# Patient Record
Sex: Female | Born: 1965 | Race: White | Hispanic: No | Marital: Married | Smoking: Never smoker
Health system: Southern US, Community
[De-identification: ages and names within clinical notes are randomized; demographics above are authoritative.]

## PROBLEM LIST (undated history)

## (undated) DIAGNOSIS — F419 Anxiety disorder, unspecified: Secondary | ICD-10-CM

## (undated) DIAGNOSIS — D649 Anemia, unspecified: Secondary | ICD-10-CM

## (undated) DIAGNOSIS — M7751 Other enthesopathy of right foot: Secondary | ICD-10-CM

## (undated) DIAGNOSIS — J302 Other seasonal allergic rhinitis: Secondary | ICD-10-CM

## (undated) DIAGNOSIS — E119 Type 2 diabetes mellitus without complications: Secondary | ICD-10-CM

## (undated) HISTORY — DX: Other enthesopathy of right foot and ankle: M77.51

## (undated) HISTORY — DX: Anemia, unspecified: D64.9

## (undated) HISTORY — DX: Type 2 diabetes mellitus without complications: E11.9

## (undated) HISTORY — DX: Other seasonal allergic rhinitis: J30.2

## (undated) HISTORY — PX: NASAL SINUS SURGERY: SHX719

## (undated) HISTORY — DX: Anxiety disorder, unspecified: F41.9

## (undated) HISTORY — PX: TONSILLECTOMY: SUR1361

---

## 2000-01-16 ENCOUNTER — Other Ambulatory Visit: Admission: RE | Admit: 2000-01-16 | Discharge: 2000-01-16 | Payer: Self-pay | Admitting: Obstetrics & Gynecology

## 2001-08-26 ENCOUNTER — Encounter: Payer: Self-pay | Admitting: Family Medicine

## 2001-08-26 ENCOUNTER — Encounter: Admission: RE | Admit: 2001-08-26 | Discharge: 2001-08-26 | Payer: Self-pay | Admitting: Family Medicine

## 2002-03-24 ENCOUNTER — Other Ambulatory Visit: Admission: RE | Admit: 2002-03-24 | Discharge: 2002-03-24 | Payer: Self-pay | Admitting: Obstetrics & Gynecology

## 2003-09-27 ENCOUNTER — Emergency Department (HOSPITAL_COMMUNITY): Admission: EM | Admit: 2003-09-27 | Discharge: 2003-09-27 | Payer: Self-pay | Admitting: *Deleted

## 2004-01-27 ENCOUNTER — Encounter: Admission: RE | Admit: 2004-01-27 | Discharge: 2004-01-27 | Payer: Self-pay | Admitting: Family Medicine

## 2004-08-21 ENCOUNTER — Encounter: Admission: RE | Admit: 2004-08-21 | Discharge: 2004-08-21 | Payer: Self-pay | Admitting: Family Medicine

## 2007-12-10 ENCOUNTER — Other Ambulatory Visit: Admission: RE | Admit: 2007-12-10 | Discharge: 2007-12-10 | Payer: Self-pay | Admitting: Obstetrics & Gynecology

## 2012-11-17 ENCOUNTER — Encounter: Payer: Self-pay | Admitting: Obstetrics & Gynecology

## 2012-11-19 ENCOUNTER — Encounter: Payer: Self-pay | Admitting: Obstetrics & Gynecology

## 2012-11-19 ENCOUNTER — Ambulatory Visit (INDEPENDENT_AMBULATORY_CARE_PROVIDER_SITE_OTHER): Payer: BC Managed Care – PPO | Admitting: Obstetrics & Gynecology

## 2012-11-19 VITALS — BP 128/78 | HR 60 | Resp 16 | Ht 65.75 in | Wt 184.8 lb

## 2012-11-19 DIAGNOSIS — Z23 Encounter for immunization: Secondary | ICD-10-CM

## 2012-11-19 DIAGNOSIS — Z01419 Encounter for gynecological examination (general) (routine) without abnormal findings: Secondary | ICD-10-CM

## 2012-11-19 MED ORDER — VITAMIN D (ERGOCALCIFEROL) 1.25 MG (50000 UNIT) PO CAPS: ORAL_CAPSULE | ORAL | Status: DC

## 2012-11-19 NOTE — Patient Instructions (Addendum)

## 2012-11-19 NOTE — Progress Notes (Addendum)
47 y.o. G0P0000 MarriedCaucasianF here for annual exam.  Cycles regular.  She is very happy that cycles are more regular.  Flow is light, lasts 4-5 days total.  She is working on diet and losing weight to see if her triglycerides will improve.  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  She has started exercising regularly.  Just got back from [place] and [place].  Was 17 days.  Then did 15 day trip to [place], [place], and [place].  Took niece on trip.    Patient's last menstrual period was 11/02/2012.          Sexually active: yes  The current method of family planning is none.    Exercising: no  Not regularly Smoker:  no  Health Maintenance: Pap:  08/21/11 WNL/negative HR HPV History of abnormal Pap:  no MMG:  2013 [facility] (with release of records, last MMG was 08/08/10--pt will be notified) Colonoscopy:  none BMD:   2004 TDaP:  ? Screening Labs: PCP has new PCP at [facility] [place], Hb today: PCP, Urine today: PCP   reports that she has never smoked. She has never used smokeless tobacco. She reports that  drinks alcohol. She reports that she does not use illicit drugs.  Past Medical History  Diagnosis Date  . Anemia   . Seasonal allergies     Past Surgical History  Procedure Laterality Date  . Tonsillectomy    . Nasal sinus surgery      Current Outpatient Prescriptions  Medication Sig Dispense Refill  . Vitamin D, Ergocalciferol, (DRISDOL) 50000 UNITS CAPS capsule Take 50,000 Units by mouth. Every two weeks      . Loratadine-Pseudoephedrine (CLARITIN-D 12 HOUR PO) Take by mouth.      . naproxen sodium (ANAPROX) 220 MG tablet Take 220 mg by mouth as needed.       No current facility-administered medications for this visit.    Family History  Problem Relation Age of Onset  . Diabetes Mother   . Colon cancer Other     maternal cousins  . Hypertension Father     ROS:  Pertinent items are noted in HPI.  Otherwise, a comprehensive ROS was negative.  Exam:   BP 128/78  Pulse 60  Resp 16  Ht 5'  5.75" (1.67 m)  Wt 184 lb 12.8 oz (83.825 kg)  BMI 30.06 kg/m2  LMP 11/02/2012  Weight change: -6lbs  Height: 5' 5.75" (167 cm)  Ht Readings from Last 3 Encounters:  11/19/12 5' 5.75" (1.67 m)    General appearance: alert, cooperative and appears stated age Head: Normocephalic, without obvious abnormality, atraumatic Neck: no adenopathy, supple, symmetrical, trachea midline and thyroid normal to inspection and palpation Lungs: clear to auscultation bilaterally Breasts: normal appearance, no masses or tenderness Heart: regular rate and rhythm Abdomen: soft, non-tender; bowel sounds normal; no masses,  no organomegaly Extremities: extremities normal, atraumatic, no cyanosis or edema Skin: Skin color, texture, turgor normal. No rashes or lesions Lymph nodes: Cervical, supraclavicular, and axillary nodes normal. No abnormal inguinal nodes palpated Neurologic: Grossly normal   Pelvic: External genitalia:  no lesions              Urethra:  normal appearing urethra with no masses, tenderness or lesions              Bartholins and Skenes: normal                 Vagina: normal appearing vagina with normal color and discharge, no lesions  Cervix: no lesions              Pap taken: no Bimanual Exam:  Uterus:  normal size, contour, position, consistency, mobility, non-tender              Adnexa: normal adnexa and no mass, fullness, tenderness               Rectovaginal: Confirms               Anus:  normal sphincter tone, no lesions  A:  Well Woman with normal exam H/O anemia, resolved Uses no BC, declines Elevated triglycerides/elevated LDLs--declines blood work today.  P:   Mammogram yearly.  Will have patient sign release for last MMG to see if due. (was 5/12--pt will be notified.) pap smear with neg HR HPV 5/13.  No pap today. Rx for Vit D to pharmacy. return annually or prn  An After Visit Summary was printed and given to the patient.

## 2012-11-27 ENCOUNTER — Telehealth: Payer: Self-pay | Admitting: Obstetrics & Gynecology

## 2012-11-27 NOTE — Telephone Encounter (Signed)
At [facility] 8/14, pt thought she had a MMG last year.  Obtained records.  Last one was 08/08/10.  Called pt personally and advised her otherwise.  She will call and scheduled.  Number provided for scheduling.  She did not want me to schedule it for her.

## 2013-08-20 ENCOUNTER — Telehealth: Payer: Self-pay | Admitting: Obstetrics & Gynecology

## 2013-08-20 NOTE — Telephone Encounter (Signed)
patient is having abdominal pain in lower right quad. for about two weeks now. Leaving to go out of country in about a week wants to see what wrong

## 2013-08-20 NOTE — Telephone Encounter (Signed)
Spoke with patient. Advised appointment available for Monday at 1600 with Dr.Miller (time per Kennon RoundsSally). Patient agreeable to date and time. Appointment scheduled. Advised patient if symptoms worsen over the weekend or any new symptoms arise she needs to seek immediate care at the Emergency Department. Patient agreeable and verbalizes understanding.  Routing to provider for final review. Patient agreeable to disposition. Will close encounter

## 2013-08-20 NOTE — Telephone Encounter (Signed)
Spoke with patient. Advised spoke with Dr.Miller and we can have patient come in today at 2:30pm for evaluation. 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 Advised would check about any other availability and give patient a call back. Patient agreeable.

## 2013-08-20 NOTE — Telephone Encounter (Signed)
Spoke with patient. Patient states that she has been having lower right sided pelvic pain on and off for two weeks. Patient states "It is not sharp pain. It feels like pinching or someone poking me with a fork." Patient has not been taking any medication for pain as she states "It is not unbearable. I just want to get it checked out before I go out of the country in five weeks." Patient denies nausea, fever, and urinary/bowel symptoms. Advised patient would need to be seen for evaluation by Dr.Miller. Patient agreeable. Advised would speak with Dr.Miller about appointment time and give patient a call back. Patient is agreeable.

## 2013-08-23 ENCOUNTER — Ambulatory Visit (INDEPENDENT_AMBULATORY_CARE_PROVIDER_SITE_OTHER): Payer: BC Managed Care – PPO | Admitting: Obstetrics & Gynecology

## 2013-08-23 ENCOUNTER — Encounter: Payer: Self-pay | Admitting: Obstetrics & Gynecology

## 2013-08-23 VITALS — BP 130/62 | HR 72 | Resp 16 | Ht 65.5 in | Wt 205.2 lb

## 2013-08-23 DIAGNOSIS — R109 Unspecified abdominal pain: Secondary | ICD-10-CM

## 2013-08-23 NOTE — Progress Notes (Signed)
Subjective:     Patient ID: Stephanie Merritt, female   DOB: 08/02/1965, 48 y.o.   MRN: [id number]  HPI 48 yo G0  Pain is not with exertion.  Sometimes feel it when sitting.  Sometimes when she's standing.  Hasn't been able to come up with a trigger.    Bowel habits are normal.  Has a bowl movement every other day.  No constipation.  Bladder habits haven't change.  She does hold her urine a lot during the day.  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  No blood in urine.    Cycles have been changing this past year.  Cycles have been about every five weeks.  Not regular, however.  Flow is "normal" for her.  First day is spotting, the second and third days are heavy and then it tapers off.    Review of Systems  All other systems reviewed and are negative.      Objective:   Physical Exam  Constitutional: She appears well-developed and well-nourished.  Cardiovascular: Normal rate and regular rhythm.   Pulmonary/Chest: Effort normal and breath sounds normal.  Abdominal: Soft. Bowel sounds are normal. She exhibits no distension and no mass. There is tenderness (RLQ). There is no guarding.  Genitourinary: Vagina normal and uterus normal. There is no rash or tenderness on the right labia. Uterus is not tender. Cervix exhibits no motion tenderness. Right adnexum displays no mass and no tenderness. Left adnexum displays no mass and no tenderness. No signs of injury around the vagina. No vaginal discharge found.  Lymphadenopathy:       Right: No inguinal adenopathy present.       Left: No inguinal adenopathy present.       Assessment:     RLQ pain x 3 weeks Low back pain Weight gain     Plan:     Will check PUS.  Suspicion right now is musculoskeletal or small hernia.  D/W CT if persists. Will do u/a when she returns for PUS.  She cannot void today.

## 2013-08-23 NOTE — Progress Notes (Signed)
PUS scheduled for 09-02-13 at 4pm here in office.  Patient declined earlier appointment due to work schedule.  Advised of cancellation policy.

## 2013-09-02 ENCOUNTER — Ambulatory Visit (INDEPENDENT_AMBULATORY_CARE_PROVIDER_SITE_OTHER): Payer: BC Managed Care – PPO | Admitting: Obstetrics & Gynecology

## 2013-09-02 ENCOUNTER — Ambulatory Visit (INDEPENDENT_AMBULATORY_CARE_PROVIDER_SITE_OTHER): Payer: BC Managed Care – PPO

## 2013-09-02 VITALS — BP 110/72 | HR 70 | Resp 16 | Ht 65.75 in | Wt 206.0 lb

## 2013-09-02 DIAGNOSIS — R319 Hematuria, unspecified: Secondary | ICD-10-CM

## 2013-09-02 DIAGNOSIS — R1031 Right lower quadrant pain: Secondary | ICD-10-CM

## 2013-09-02 DIAGNOSIS — R1032 Left lower quadrant pain: Secondary | ICD-10-CM

## 2013-09-02 DIAGNOSIS — R109 Unspecified abdominal pain: Secondary | ICD-10-CM

## 2013-09-02 MED ORDER — VITAMIN D (ERGOCALCIFEROL) 1.25 MG (50000 UNIT) PO CAPS: ORAL_CAPSULE | ORAL | Status: DC

## 2013-09-02 NOTE — Progress Notes (Signed)
48 y.o. G0P0 Marriedfemale here for a pelvic ultrasound.  Pt seen 5/18 for three weeks of RLQ pain.  Pt did have tenderness on the RLQ on exam but no specific masses were noted.  Since that time, pain has resolved but is now on the right.  Denies urinary complaints.  Pt reports she does not have regular BMs and can go two to three days between.  Sometimes, bowel movements are hard and uncomfortable.  Pt traveling out of the country in June for 19 days.  Patient's last menstrual period was 08/02/2013.  Sexually active:  yes  Contraception: no method  FINDINGS: UTERUS: 7.7 x 5.7 x 4.0cm with 1.2cm intramural fibroid EMS: 9.44mm (cycle due to start within next two days) ADNEXA:   Left ovary 1.8 x 1.0 x 1.5cm   Right ovary 2.4 x 1. X 1.6cm with 11mm follicle CUL DE SAC: no free fluid  Pt and I reviewed ultrasound images and results.  Pt reports that the pain that had been persistent for three weeks, stopped for 4 days last week and then returned on the lower left side (where it had been on the right).  When I saw her for [facility], I really felt this could be a hernia based on description.  Now that the pain has moved, that is not possible.  Pt reassured that ultrasound is negative.  Pt does have a hx of constipation and irregular bowel movements.  Recommended she start colace 100mg  daily and Align probiotics as well as increase consumption of water.  Pt going on 19 days trip, leaving second week in June.  She is going to monitor symptoms and if anything worsens, she will call and I will proceed with CT scan.  If everything remains the same or improves, she will give me and update in two to three weeks.  All questions answered.  Assessment:  Intermittent RLQ and LLQ pain, small fibroids, small ovarian follice Plan: Trial of Colace and Align.  Pt to give report in two to three weeks.  May need to proceed with CT, although I feel this is much less necessary as pain has moved and actually resolved on right side  for several days.  ~20 minutes spent with patient >50% of time was in face to face discussion of above.

## 2013-09-03 ENCOUNTER — Encounter: Payer: Self-pay | Admitting: Obstetrics & Gynecology

## 2013-09-03 NOTE — Addendum Note (Signed)
Addended by: Elisha Headland on: 09/03/2013 05:11 PM   Modules accepted: Orders

## 2013-09-04 LAB — URINALYSIS, MICROSCOPIC ONLY
Bacteria, UA: NONE SEEN
CRYSTALS: NONE SEEN
Casts: NONE SEEN

## 2014-02-10 ENCOUNTER — Ambulatory Visit: Payer: BC Managed Care – PPO | Admitting: Obstetrics & Gynecology

## 2014-03-30 ENCOUNTER — Encounter: Payer: Self-pay | Admitting: Obstetrics & Gynecology

## 2014-03-30 ENCOUNTER — Ambulatory Visit (INDEPENDENT_AMBULATORY_CARE_PROVIDER_SITE_OTHER): Payer: BC Managed Care – PPO | Admitting: Obstetrics & Gynecology

## 2014-03-30 VITALS — BP 128/62 | HR 84 | Resp 16 | Ht 65.5 in | Wt 204.4 lb

## 2014-03-30 DIAGNOSIS — Z124 Encounter for screening for malignant neoplasm of cervix: Secondary | ICD-10-CM

## 2014-03-30 DIAGNOSIS — Z01419 Encounter for gynecological examination (general) (routine) without abnormal findings: Secondary | ICD-10-CM

## 2014-03-30 DIAGNOSIS — Z0000 Encounter for general adult medical examination without abnormal findings: Secondary | ICD-10-CM

## 2014-03-30 LAB — COMPREHENSIVE METABOLIC PANEL
ALBUMIN: 4.2 g/dL (ref 3.5–5.2)
ALK PHOS: 70 U/L (ref 39–117)
ALT: 13 U/L (ref 0–35)
AST: 15 U/L (ref 0–37)
BUN: 12 mg/dL (ref 6–23)
CO2: 24 meq/L (ref 19–32)
Calcium: 9.5 mg/dL (ref 8.4–10.5)
Chloride: 106 mEq/L (ref 96–112)
Creat: 0.72 mg/dL (ref 0.50–1.10)
Glucose, Bld: 86 mg/dL (ref 70–99)
Potassium: 4 mEq/L (ref 3.5–5.3)
SODIUM: 138 meq/L (ref 135–145)
Total Bilirubin: 0.7 mg/dL (ref 0.2–1.2)
Total Protein: 7 g/dL (ref 6.0–8.3)

## 2014-03-30 LAB — LIPID PANEL
Cholesterol: 181 mg/dL (ref 0–200)
HDL: 36 mg/dL — ABNORMAL LOW (ref >39)
LDL CALC: 114 mg/dL — AB (ref 0–99)
Total CHOL/HDL Ratio: 5 Ratio
Triglycerides: 156 mg/dL — ABNORMAL HIGH (ref <150)
VLDL: 31 mg/dL (ref 0–40)

## 2014-03-30 NOTE — Progress Notes (Signed)
48 y.o. G0P0000 MarriedCaucasianF here for annual exam.  Reports she has started having some hot flashes.  Cycles are every 4-6 weeks.  This isn't a change for the patient.  D/w pt hot flashes could also be thyroid or diabetes.     Patient's last menstrual period was 02/06/2014 (approximate).          Sexually active: Yes.    The current method of family planning is none.    Exercising: No.  not regularly Smoker:  no  Health Maintenance: Pap:  08/21/11 WNL/negative HR HPV History of abnormal Pap:  no [facility]:  [facility] Colonoscopy:  none BMD:   none TDaP:  11/19/12 Screening Labs: today (ate at breakfast), Hb today: pending, Urine today: not done due to vaginal bleeding   reports that she has never smoked. She has never used smokeless tobacco. She reports that she does not drink alcohol or use illicit drugs.  Past Medical History  Diagnosis Date  . Anemia   . Seasonal allergies     Past Surgical History  Procedure Laterality Date  . Tonsillectomy    . Nasal sinus surgery      Current Outpatient Prescriptions  Medication Sig Dispense Refill  . Vitamin D, Ergocalciferol, (DRISDOL) 50000 UNITS CAPS capsule Take one weekly (Patient taking differently: Taking once every other week) 12 capsule 4  . acetaminophen (TYLENOL) 500 MG tablet Take 500 mg by mouth every 6 (six) hours as needed.    . naproxen sodium (ANAPROX) 220 MG tablet Take 220 mg by mouth as needed.     No current facility-administered medications for this visit.    Family History  Problem Relation Age of Onset  . Diabetes Mother   . Colon cancer Other     maternal cousins  . Hypertension Father     ROS:  Pertinent items are noted in HPI.  Otherwise, a comprehensive ROS was negative.  Exam:   BP 128/62 mmHg  Pulse 84  Resp 16  Ht 5' 5.5" (1.664 m)  Wt 204 lb 6.4 oz (92.715 kg)  BMI 33.48 kg/m2  LMP 02/06/2014 (Approximate)  Weight change:  +20#  Height: 5' 5.5" (166.4 cm)  Ht Readings from Last 3  Encounters:  03/30/14 5' 5.5" (1.664 m)  09/02/13 5' 5.75" (1.67 m)  08/23/13 5' 5.5" (1.664 m)    General appearance: alert, cooperative and appears stated age Head: Normocephalic, without obvious abnormality, atraumatic Neck: no adenopathy, supple, symmetrical, trachea midline and thyroid normal to inspection and palpation Lungs: clear to auscultation bilaterally Breasts: normal appearance, no masses or tenderness Heart: regular rate and rhythm Abdomen: soft, non-tender; bowel sounds normal; no masses,  no organomegaly Extremities: extremities normal, atraumatic, no cyanosis or edema Skin: Skin color, texture, turgor normal. No rashes or lesions Lymph nodes: Cervical, supraclavicular, and axillary nodes normal. No abnormal inguinal nodes palpated Neurologic: Grossly normal   Pelvic: External genitalia:  no lesions              Urethra:  normal appearing urethra with no masses, tenderness or lesions              Bartholins and Skenes: normal                 Vagina: normal appearing vagina with normal color and discharge, no lesions              Cervix: no lesions, started menses today  Pap taken: Yes.   Bimanual Exam:  Uterus:  normal size, contour, position, consistency, mobility, non-tender              Adnexa: normal adnexa and no mass, fullness, tenderness               Rectovaginal: Confirms               Anus:  normal sphincter tone, no lesions  Chaperone was present for exam.  A:  Well Woman with normal exam H/O anemia, resolved Uses no BC, declines Elevated triglycerides/elevated LDLs--declines blood work today.  P: Mammogram yearly. Pt clearly KNOWS this is overdue.  States she will call and schedule.   pap smear with neg HR HPV 5/13. Pap today. Vit D, TSH, CMP, Lipids today return annually or prn

## 2014-03-31 LAB — TSH: TSH: 1.57 u[IU]/mL (ref 0.350–4.500)

## 2014-03-31 LAB — VITAMIN D 25 HYDROXY (VIT D DEFICIENCY, FRACTURES): Vit D, 25-Hydroxy: 21 ng/mL — ABNORMAL LOW (ref 30–100)

## 2014-04-04 LAB — IPS PAP TEST WITH REFLEX TO HPV

## 2014-04-12 NOTE — Addendum Note (Signed)
Addended by: Jerene BearsMILLER, Alex Leahy S on: 04/12/2014 11:07 AM   Modules accepted: Kipp BroodSmartSet

## 2014-04-14 ENCOUNTER — Telehealth: Payer: Self-pay

## 2014-04-14 NOTE — Telephone Encounter (Signed)
-----   Message from Annamaria BootsMary Suzanne Miller, MD sent at 04/12/2014 11:07 AM EST ----- Inform pt CMP and TSH were normal.  Vit D is low at 21.  Needs 50K IU weekly for 12 weeks and then needs to take 2000 IU daily.  Repeat at [facility] next year.  Order placed.    Lipids are abnormal as well.  LDL 114 and HDL very low at 36.  This is an independent risk factor for development of heart disease.  She really needs to be working on exercise and weight loss.  She really needs to work on 10-20 pounds over the next year.  Will repeat at [facility] in one year.    Pap 02 recall.

## 2014-04-14 NOTE — Telephone Encounter (Signed)
Lmtcb//kn 

## 2014-04-25 MED ORDER — VITAMIN D (ERGOCALCIFEROL) 1.25 MG (50000 UNIT) PO CAPS: ORAL_CAPSULE | ORAL | Status: DC

## 2014-04-25 NOTE — Addendum Note (Signed)
Addended by: Jerene BearsMILLER, Laurann Mcmorris S on: 04/25/2014 01:20 PM   Modules accepted: Orders, SmartSet

## 2014-06-21 NOTE — Telephone Encounter (Signed)
Patient notified of all results-see result note//kn 

## 2015-05-30 ENCOUNTER — Ambulatory Visit: Payer: BC Managed Care – PPO | Admitting: Obstetrics & Gynecology

## 2015-07-24 ENCOUNTER — Ambulatory Visit (INDEPENDENT_AMBULATORY_CARE_PROVIDER_SITE_OTHER): Payer: BC Managed Care – PPO | Admitting: Obstetrics and Gynecology

## 2015-07-24 ENCOUNTER — Encounter: Payer: Self-pay | Admitting: Obstetrics and Gynecology

## 2015-07-24 VITALS — BP 130/70 | HR 84 | Resp 16 | Ht 65.5 in | Wt 211.0 lb

## 2015-07-24 DIAGNOSIS — Z833 Family history of diabetes mellitus: Secondary | ICD-10-CM

## 2015-07-24 DIAGNOSIS — Z01419 Encounter for gynecological examination (general) (routine) without abnormal findings: Secondary | ICD-10-CM | POA: Diagnosis not present

## 2015-07-24 DIAGNOSIS — E559 Vitamin D deficiency, unspecified: Secondary | ICD-10-CM

## 2015-07-24 DIAGNOSIS — Z0000 Encounter for general adult medical examination without abnormal findings: Secondary | ICD-10-CM | POA: Diagnosis not present

## 2015-07-24 NOTE — Patient Instructions (Signed)

## 2015-07-24 NOTE — Progress Notes (Signed)
Patient ID: Stephanie Merritt, female   DOB: 02/09/1966, 50 y.o.   MRN: [id number] 50 y.o. G0P0000 MarriedCaucasianF here for annual exam.  Cycles q 4-6 weeks, no change. They have gotten heavier and crampier in the last few years. No BTB. Cramps are tolerable, helped with OTC medication. Sexually active, +/- contraception. She has a h/o low vit d, was taking 50,000 IU every 1-2 weeks, not good about taking pills. She ran out 3 months ago.  Period Duration (Days): 4-6 days  Period Pattern: (!) Irregular Menstrual Flow: Heavy Menstrual Control: Tampon Menstrual Control Change Freq (Hours): changes super tampon every 2 hours the first two days of her cycle Dysmenorrhea: (!) Moderate Dysmenorrhea Symptoms: Cramping  Patient's last menstrual period was 07/08/2015.          Sexually active: Yes.    The current method of family planning is none.    Exercising: No.  The patient does not participate in regular exercise at present. Smoker:  no  Health Maintenance: Pap:  03-30-14 WNL History of abnormal Pap:  no MMG:  09-06-14- abnormal- left breast BX- WNL  Colonoscopy: Never BMD:   Years ago- WNL Per Patient  TDaP:  11-19-12 Gardasil: N/A   reports that she has never smoked. She has never used smokeless tobacco. She reports that she does not drink alcohol or use illicit drugs. Teaches English 9 and History.   Past Medical History  Diagnosis Date  . Anemia   . Seasonal allergies     Past Surgical History  Procedure Laterality Date  . Tonsillectomy    . Nasal sinus surgery      Current Outpatient Prescriptions  Medication Sig Dispense Refill  . acetaminophen (TYLENOL) 500 MG tablet Take 500 mg by mouth every 6 (six) hours as needed.    . naproxen sodium (ANAPROX) 220 MG tablet Take 220 mg by mouth as needed.    . Vitamin D, Ergocalciferol, (DRISDOL) 50000 UNITS CAPS capsule Take one weekly 12 capsule 4   No current facility-administered medications for this visit.    Family History   Problem Relation Age of Onset  . Diabetes Mother   . Colon cancer Other     maternal cousins  . Hypertension Father     Review of Systems  Constitutional: Negative.   HENT: Negative.   Eyes: Negative.   Respiratory: Negative.   Cardiovascular: Negative.   Gastrointestinal: Negative.   Endocrine: Negative.   Genitourinary: Positive for menstrual problem.       Irregular / heavy menstrual cycles   Musculoskeletal: Negative.   Skin: Negative.   Allergic/Immunologic: Negative.   Neurological: Negative.   Psychiatric/Behavioral: Negative.   She is having BM every time she eats, this has been happening since she was in [place] last July. No discomfort. Can be watery to loose.   Exam:   BP 130/70 mmHg  Pulse 84  Resp 16  Ht 5' 5.5" (1.664 m)  Wt 211 lb (95.709 kg)  BMI 34.57 kg/m2  LMP 07/08/2015  Weight change: @WEIGHTCHANGE @ Height:   Height: 5' 5.5" (166.4 cm)  Ht Readings from Last 3 Encounters:  07/24/15 5' 5.5" (1.664 m)  03/30/14 5' 5.5" (1.664 m)  09/02/13 5' 5.75" (1.67 m)    General appearance: alert, cooperative and appears stated age Head: Normocephalic, without obvious abnormality, atraumatic Neck: no adenopathy, supple, symmetrical, trachea midline and thyroid normal to inspection and palpation Lungs: clear to auscultation bilaterally Breasts: normal appearance, no masses or tenderness Heart: regular rate and  rhythm Abdomen: soft, non-tender; bowel sounds normal; no masses,  no organomegaly Extremities: extremities normal, atraumatic, no cyanosis or edema Skin: Skin color, texture, turgor normal. No rashes or lesions Lymph nodes: Cervical, supraclavicular, and axillary nodes normal. No abnormal inguinal nodes palpated Neurologic: Grossly normal   Pelvic: External genitalia:  no lesions              Urethra:  normal appearing urethra with no masses, tenderness or lesions              Bartholins and Skenes: normal                 Vagina: normal appearing  vagina with normal color and discharge, no lesions              Cervix: no lesions               Bimanual Exam:  Uterus:  normal size, contour, position, consistency, mobility, non-tender              Adnexa: no mass, fullness, tenderness               Rectovaginal: Confirms               Anus:  normal sphincter tone, no lesions  Chaperone was present for exam.  A:  Well Woman with normal exam  Loose stools  P:   No pap this year  Mammogram due in May  F/U with GI for evaluation of bowel changes and Colonoscopy  Vit D, HgbA1C, FLP, TSH, CMP (she will return for fasting labs)  Discussed breast self exam  Discussed calcium and vit D intake

## 2015-07-24 NOTE — Addendum Note (Signed)
Addended by: Joeseph AmorFAST, Leverne Amrhein L on: 07/24/2015 04:21 PM   Modules accepted: Orders

## 2015-07-29 LAB — CBC
HCT: 36.8 % (ref 35.0–45.0)
Hemoglobin: 11.3 g/dL — ABNORMAL LOW (ref 11.7–15.5)
MCH: 22.7 pg — AB (ref 27.0–33.0)
MCHC: 30.7 g/dL — ABNORMAL LOW (ref 32.0–36.0)
MCV: 74 fL — AB (ref 80.0–100.0)
MPV: 9 fL (ref 7.5–12.5)
PLATELETS: 430 10*3/uL — AB (ref 140–400)
RBC: 4.97 MIL/uL (ref 3.80–5.10)
RDW: 17.3 % — AB (ref 11.0–15.0)
WBC: 8.5 10*3/uL (ref 3.8–10.8)

## 2015-07-29 LAB — COMPREHENSIVE METABOLIC PANEL
ALT: 12 U/L (ref 6–29)
AST: 12 U/L (ref 10–35)
Albumin: 4.1 g/dL (ref 3.6–5.1)
Alkaline Phosphatase: 69 U/L (ref 33–130)
BILIRUBIN TOTAL: 0.6 mg/dL (ref 0.2–1.2)
BUN: 12 mg/dL (ref 7–25)
CHLORIDE: 105 mmol/L (ref 98–110)
CO2: 23 mmol/L (ref 20–31)
CREATININE: 0.81 mg/dL (ref 0.50–1.05)
Calcium: 9.2 mg/dL (ref 8.6–10.4)
GLUCOSE: 90 mg/dL (ref 65–99)
Potassium: 4.4 mmol/L (ref 3.5–5.3)
Sodium: 138 mmol/L (ref 135–146)
Total Protein: 7.3 g/dL (ref 6.1–8.1)

## 2015-07-29 LAB — LIPID PANEL
Cholesterol: 198 mg/dL (ref 125–200)
HDL: 43 mg/dL — AB (ref >=46)
LDL CALC: 120 mg/dL (ref <130)
Total CHOL/HDL Ratio: 4.6 Ratio (ref <=5.0)
Triglycerides: 176 mg/dL — ABNORMAL HIGH (ref <150)
VLDL: 35 mg/dL — ABNORMAL HIGH (ref <30)

## 2015-07-29 LAB — TSH: TSH: 3 m[IU]/L

## 2015-07-29 LAB — HEMOGLOBIN A1C
HEMOGLOBIN A1C: 5.8 % — AB (ref <5.7)
MEAN PLASMA GLUCOSE: 120 mg/dL

## 2015-07-31 ENCOUNTER — Telehealth: Payer: Self-pay | Admitting: *Deleted

## 2015-07-31 LAB — VITAMIN D 25 HYDROXY (VIT D DEFICIENCY, FRACTURES): VIT D 25 HYDROXY: 18 ng/mL — AB (ref 30–100)

## 2015-07-31 NOTE — Telephone Encounter (Signed)
Patient is past due for recommended breast imaging due 02/2015  Last MMG and Results:   09/06/14 - stereotactic biopsy showing benign fibrocystic changes.  Pathology results are concordant with imaging findings.  Recommendation:  Follow-up left mammogram in 6 months  [facility], patient has not scheduled or completed recommended breast imaging due 02/2015.  Pt will be due for screening bilateral in May, but will need diagnostic left also.  Please call patient to schedule.  [facility]

## 2015-07-31 NOTE — Telephone Encounter (Signed)
LMTC in regards to setting up follow up mammogram-eh

## 2015-08-01 MED ORDER — VITAMIN D (ERGOCALCIFEROL) 1.25 MG (50000 UNIT) PO CAPS: ORAL_CAPSULE | ORAL | Status: DC

## 2015-08-01 NOTE — Addendum Note (Signed)
Addended by: Luisa DagoPHILLIPS, Sagan Wurzel C on: 08/01/2015 01:41 PM   Modules accepted: Orders

## 2015-08-01 NOTE — Telephone Encounter (Signed)
I left another message for patient to call -eh

## 2015-08-01 NOTE — Telephone Encounter (Signed)
Pt is notified of need for diagnostic and screening mammogram.  Patient states she is not able to do at this time due to demands of job.  She will do this summer when time allows.

## 2015-08-10 ENCOUNTER — Encounter: Payer: Self-pay | Admitting: *Deleted

## 2015-08-10 NOTE — Telephone Encounter (Signed)
Patient unable to schedule at this time.  Letter created.  Please advise recall.

## 2015-08-15 NOTE — Telephone Encounter (Signed)
Agree with sending letter.  Pt is aware of recommendation.  Ok to remove from recall.

## 2015-08-17 NOTE — Telephone Encounter (Signed)
Recall letter signed by Dr. Miller, marked as sent and mailed.  Pt removed from current recall.  Closing encounter. 

## 2015-08-28 ENCOUNTER — Telehealth: Payer: Self-pay | Admitting: *Deleted

## 2015-08-28 NOTE — Telephone Encounter (Signed)
Follow-up call to patient. See previous result note. Patient declined to schedule ultrasound stating she had it done last year.  Per DPR, cal leave detailed message on cell number and VM message confirms first and last name. Left message explaining this is a follow- up call to review recommendations from Dr Oscar LaJertson. Left message to call back.

## 2015-08-28 NOTE — Telephone Encounter (Signed)
-----   Message from Romualdo BolkJill Evelyn Jertson, MD sent at 08/09/2015  4:34 PM EDT ----- Please let the patient know that her last ultrasound was 2 years ago. She wasn't checked for anemia at that time, but she states her cycles are heavier and crampier than they were. I would still recommend she f/u for an ultrasound, possible sonohysterogram, possible biopsy. Things can change in 2 years. Ultimately the decision is her, but we could be missing pathology.

## 2015-09-27 NOTE — Telephone Encounter (Signed)
Follow-up call to patient. Again, voice mail confirms patient's name and DPR states can leave detailed message.  Left message calling to discuss important testing recommended by Dr Oscar LaJertson and important reasons why this was recommended. Stressed that we would prefer to discuss personally instead of over voice mail to be sure she understands significance in not having testing done. Left message to call back.

## 2015-10-18 NOTE — Telephone Encounter (Signed)
No response from patient. Detailed message left X2. (allowed per DPR). Any further action?

## 2015-10-19 NOTE — Telephone Encounter (Signed)
No, she has been informed. Thanks

## 2015-10-19 NOTE — Telephone Encounter (Signed)
Encounter closed

## 2016-03-05 ENCOUNTER — Ambulatory Visit (HOSPITAL_BASED_OUTPATIENT_CLINIC_OR_DEPARTMENT_OTHER)
Admission: RE | Admit: 2016-03-05 | Discharge: 2016-03-05 | Disposition: A | Discharge status: Home / Self Care | Payer: BC Managed Care – PPO | Source: Ambulatory Visit | Attending: Podiatry | Admitting: Podiatry

## 2016-03-05 ENCOUNTER — Ambulatory Visit (INDEPENDENT_AMBULATORY_CARE_PROVIDER_SITE_OTHER): Payer: BC Managed Care – PPO | Admitting: Podiatry

## 2016-03-05 ENCOUNTER — Encounter: Payer: Self-pay | Admitting: Podiatry

## 2016-03-05 DIAGNOSIS — M7731 Calcaneal spur, right foot: Secondary | ICD-10-CM

## 2016-03-05 DIAGNOSIS — R52 Pain, unspecified: Secondary | ICD-10-CM | POA: Diagnosis not present

## 2016-03-05 DIAGNOSIS — M7661 Achilles tendinitis, right leg: Secondary | ICD-10-CM | POA: Diagnosis not present

## 2016-03-05 MED ORDER — MELOXICAM 15 MG PO TABS
15.0000 mg | ORAL_TABLET | Freq: Every day | ORAL | 2 refills | Status: AC
Start: 2016-03-05 — End: 2017-03-05

## 2016-03-05 NOTE — Patient Instructions (Signed)

## 2016-03-05 NOTE — Progress Notes (Signed)
   Subjective:    Patient ID: Stephanie Merritt, female    DOB: 09/17/1965, 50 y.o.   MRN: [id number]  HPI  50 year old female presents the also concerns of posterior right heel pain which is been ongoing for about 1 year and is been intermittent in nature. She states that she has pain in the morning she first gets up of she sits down and stands backed out. She describes as a throbbing sensation. She denies any recent injury or trauma. No treatment. No numbness or tingling. The pain does not wake her up at night. No other complaints.  Review of Systems  All other systems reviewed and are negative.      Objective:   Physical Exam General: AAO x3, NAD  Dermatological: Skin is warm, dry and supple bilateral. Nails x 10 are well manicured; remaining integument appears unremarkable at this time. There are no open sores, no preulcerative lesions, no rash or signs of infection present.  Vascular: Dorsalis Pedis artery and Posterior Tibial artery pedal pulses are 2/4 bilateral with immedate capillary fill time. Pedal hair growth present. There is no pain with calf compression, swelling, warmth, erythema.   Neruologic: Grossly intact via light touch bilateral. Vibratory intact via tuning fork bilateral. Protective threshold with Semmes Wienstein monofilament intact to all pedal sites bilateral.   Musculoskeletal: Tenderness to the posterior aspect of the right heel on the prominent retrocalcaneal exostosis, distal portion of the Achilles tendon. The tendon appears to be intact. There is no defect noted and Thompson test is negative. There is no overlying edema, erythema, increase in warmth. There is no pain with lateral compression of calcaneus and no other areas of tenderness. Muscular strength 5/5 in all groups tested bilateral.  Gait: Unassisted, Nonantalgic.         Assessment & Plan:  50 year old female with right achilles tendonitis/heel spur -Treatment options discussed including all  alternatives, risks, and complications -Etiology of symptoms were discussed -X-rays ordered and reviewed with the patient -Night splint -Offloading heel pad -Stretching -Prescribed mobic. Discussed side effects of the medication and directed to stop if any are to occur and call the office.  -Shoe changes. -Ice -Follow-up in 4 weeks or sooner if any problems arise. In the meantime, encouraged to call the office with any questions, concerns, change in symptoms.   Ovid CurdMatthew Leib Elahi, DPM

## 2016-03-07 ENCOUNTER — Ambulatory Visit: Payer: BC Managed Care – PPO | Admitting: Podiatry

## 2016-06-11 ENCOUNTER — Encounter: Payer: Self-pay | Admitting: Podiatry

## 2016-06-11 ENCOUNTER — Ambulatory Visit (INDEPENDENT_AMBULATORY_CARE_PROVIDER_SITE_OTHER): Payer: BC Managed Care – PPO | Admitting: Podiatry

## 2016-06-11 DIAGNOSIS — M7731 Calcaneal spur, right foot: Secondary | ICD-10-CM | POA: Diagnosis not present

## 2016-06-11 DIAGNOSIS — M7661 Achilles tendinitis, right leg: Secondary | ICD-10-CM

## 2016-06-11 MED ORDER — METHYLPREDNISOLONE 4 MG PO TBPK: ORAL_TABLET | ORAL | 0 refills | Status: DC

## 2016-06-11 NOTE — Progress Notes (Signed)
Subjective: [name] presents to the office today for follow-up and continued pain to the back of the right heel. She states that overall it has improved. The night splint that she was wearing his been helping with the pain in the morning but she gets pain after skin on her feet for some time. She points to the posterior heel which is the majority of symptoms.she denies any recent injury or trauma. No swelling or redness. She has been stretching and icing as well as taking meloxicam intermittently. She has no new concerns today. Denies any systemic complaints such as fevers, chills, nausea, vomiting. No acute changes since last appointment, and no other complaints at this time.   Objective: AAO x3, NAD DP/PT pulses palpable bilaterally, CRT less than 3 seconds Retrocalcaneal exostosis palpable off the right posterior heel with mild tenderness to palpation on this area on the insertion of the Achilles tendon. There is no defect noted win the Achilles tendon and Janee Mornhompson is negative. No edema, erythema or increase in warmth. No pain with lateral compression of the calcaneous.  No open lesions or pre-ulcerative lesions.  No pain with calf compression, swelling, warmth, erythema  Assessment: Retrocalcaneal exostosis/tendonitis  Plan: -All treatment options discussed with the patient including all alternatives, risks, complications.  -She was asking about an injection today. Discussed a steroid injection as well as immobilization in a cam boot however because of this she wishes to hold off. -Will try medrol dose pack. Hold mobic. If symptoms continue discussed doing flector patch to the area and we can order this for her but will hold until after the steroids if needed. -Rx provided for PT -Continue home stretching, icing daily. Night splint -RTC in 3-4 weeks or sooner if needed.  -Patient encouraged to call the office with any questions, concerns, change in symptoms.   Ovid CurdMatthew Wagoner, DPM

## 2016-07-02 ENCOUNTER — Encounter: Payer: Self-pay | Admitting: Podiatry

## 2016-07-02 ENCOUNTER — Ambulatory Visit (INDEPENDENT_AMBULATORY_CARE_PROVIDER_SITE_OTHER): Payer: BC Managed Care – PPO | Admitting: Podiatry

## 2016-07-02 DIAGNOSIS — M7731 Calcaneal spur, right foot: Secondary | ICD-10-CM | POA: Diagnosis not present

## 2016-07-02 DIAGNOSIS — M7661 Achilles tendinitis, right leg: Secondary | ICD-10-CM

## 2016-07-02 MED ORDER — DICLOFENAC EPOLAMINE 1.3 % TD PTCH: 1.0000 | MEDICATED_PATCH | Freq: Two times a day (BID) | TRANSDERMAL | 1 refills | Status: DC

## 2016-07-03 NOTE — Progress Notes (Signed)
Subjective: [name] presents to the office today for follow-up and continued pain to the back of the right heel. She states that she is not getting the sharp shooting pain that she was having before but there is still painful. She is concerned about the pain is she wants to enjoy her summer vacation. She did not start physical therapy due to time constraints which she has not call to try to schedule this. She states that she's been stretching more which is been helping as well as using the night splint. She has not been taking the anti-inflammatories.  Denies any systemic complaints such as fevers, chills, nausea, vomiting. No acute changes since last appointment, and no other complaints at this time.   Objective: AAO x3, NAD DP/PT pulses palpable bilaterally, CRT less than 3 seconds Retrocalcaneal exostosis palpable off the right posterior heel with mild tenderness to palpation on this area on the insertion of the Achilles tendon. There is no defect noted win the Achilles tendon and Stephanie Merritt is negative. There is mild erythema from irritation in shoes but there is no ascending synovitis or increase in warmth. There does be to be a trace amount of edema to the posterior lateral calcaneus however minimal. No pain with lateral compression of the calcaneous.  No open lesions or pre-ulcerative lesions.  No pain with calf compression, swelling, warmth, erythema  Assessment: Retrocalcaneal exostosis/tendonitis  Plan: -All treatment options discussed with the patient including all alternatives, risks, complications.  -At this time a discussed with her steroid injection and immobilization over she declines this today -I ordered Flector patches -Encouraged her to start physical therapy. She will call to see if they can do late hours. -Continue with stretching, icing daily. -She wished about any surgical intervention -Discussed custom orthotics orthotic -RTC as scheduled or sooner if needed.    Stephanie Merritt, DPM

## 2016-07-04 ENCOUNTER — Telehealth: Payer: Self-pay | Admitting: *Deleted

## 2016-07-04 DIAGNOSIS — M7731 Calcaneal spur, right foot: Secondary | ICD-10-CM

## 2016-07-04 DIAGNOSIS — M7661 Achilles tendinitis, right leg: Secondary | ICD-10-CM

## 2016-07-04 NOTE — Telephone Encounter (Signed)
Stephanie Merritt - [facility] states needs PT rx faxed to office. I reviewed Dr. Gabriel RungWagoner's LOV clinical and he had ordered PT for right achilles tendonitis. [facility]

## 2016-07-25 NOTE — Progress Notes (Addendum)
51 y.o. G0P0000 MarriedCaucasianF here for annual exam.  Pt has had some GI issues this week with associated diarrhea the last two days.  Did eat more normally this morning and is feeling some better.  Has not had diarrhea today but feels her abdomen is a little "rumbley".  Major issue for her is a heel bone spur.  Having some tendonitis with this.  Seeing Dr. Ardelle Anton and PT.  This has helped.  Pt had not done follow up on her breasts.  Did have a biopsy 5/16 with follow up.    Bleeding has changed over the past year.  Cycles are about every six weeks now.  Flow lasts 5 days.  PCP:  Dr. Luciana Axe.  Was seen in her office in February.    Patient's last menstrual period was 07/03/2016 (approximate).          Sexually active: Yes.    The current method of family planning is none.    Exercising: No.  The patient does not participate in regular exercise at present. Smoker:  no  Health Maintenance: Pap:  03/30/14 Neg   08/21/11 Neg. HR HPV:neg  History of abnormal Pap:  no MMG:  09/06/14 Left Biopsy: benign results. recommended f/u 6 months. Patient has not f/u since then Colonoscopy:  Never.  Declines.  Would consider a Cologuard.  States she's done a lot of research about this. BMD:  Never TDaP:  11/2012 Pneumonia vaccine(s):  N/A Zostavax:   N/A Hep C testing: N/A Screening Labs: PCP   reports that she has never smoked. She has never used smokeless tobacco. She reports that she does not drink alcohol or use drugs.  Past Medical History:  Diagnosis Date  . Anemia   . Bone spur of right foot   . Seasonal allergies     Past Surgical History:  Procedure Laterality Date  . NASAL SINUS SURGERY    . TONSILLECTOMY      Current Outpatient Prescriptions  Medication Sig Dispense Refill  . meloxicam (MOBIC) 15 MG tablet Take 1 tablet (15 mg total) by mouth daily. 30 tablet 2  . naproxen sodium (ANAPROX) 220 MG tablet Take 220 mg by mouth as needed.    . Vitamin D, Ergocalciferol, (DRISDOL)  50000 units CAPS capsule Take one weekly 12 capsule 4   No current facility-administered medications for this visit.     Family History  Problem Relation Age of Onset  . Diabetes Mother   . Colon cancer Other     maternal cousins  . Hypertension Father     ROS:  Pertinent items are noted in HPI.  Otherwise, a comprehensive ROS was negative.  Exam:   BP 130/76 (BP Location: Right Arm, Patient Position: Sitting, Cuff Size: Normal)   Pulse 84   Resp 16   Ht 5' 5.5" (1.664 m)   Wt 213 lb (96.6 kg)   LMP 07/03/2016 (Approximate)   BMI 34.91 kg/m   Weight change: +9  Height: 5' 5.5" (166.4 cm)  Ht Readings from Last 3 Encounters:  07/26/16 5' 5.5" (1.664 m)  07/24/15 5' 5.5" (1.664 m)  03/30/14 5' 5.5" (1.664 m)    General appearance: alert, cooperative and appears stated age Head: Normocephalic, without obvious abnormality, atraumatic Neck: no adenopathy, supple, symmetrical, trachea midline and thyroid normal to inspection and palpation Lungs: clear to auscultation bilaterally Breasts: normal appearance, no masses or tenderness Heart: regular rate and rhythm Abdomen: soft, non-tender; bowel sounds normal; no masses,  no organomegaly Extremities:  extremities normal, atraumatic, no cyanosis or edema Skin: Skin color, texture, turgor normal. No rashes or lesions Lymph nodes: Cervical, supraclavicular, and axillary nodes normal. No abnormal inguinal nodes palpated Neurologic: Grossly normal   Pelvic: External genitalia:  no lesions              Urethra:  normal appearing urethra with no masses, tenderness or lesions              Bartholins and Skenes: normal                 Vagina: normal appearing vagina with normal color and discharge, no lesions              Cervix: no lesions              Pap taken: Yes.   Bimanual Exam:  Uterus:  normal size, contour, position, consistency, mobility, non-tender              Adnexa: normal adnexa and no mass, fullness, tenderness                Rectovaginal: Confirms               Anus:  Declined rectal exam  Chaperone was present for exam.  A:  Well Woman with normal exam Has never used OCPs Perimenopausal bleeding h/O elevated tirglycerides and elevated LDLs in the past.  Declines having this tested today  P:   Mammogram guidelines reviewed.  Pt knows she did not do anything for follow up from MMG in 2016 Declines colonoscopy.  Willing to do cologuard. pap smear and HR HPV obtained today Vit D and HbA1C today return annually or prn

## 2016-07-26 ENCOUNTER — Ambulatory Visit (INDEPENDENT_AMBULATORY_CARE_PROVIDER_SITE_OTHER): Payer: BC Managed Care – PPO | Admitting: Obstetrics & Gynecology

## 2016-07-26 ENCOUNTER — Encounter: Payer: Self-pay | Admitting: Obstetrics & Gynecology

## 2016-07-26 ENCOUNTER — Other Ambulatory Visit (HOSPITAL_COMMUNITY)
Admission: RE | Admit: 2016-07-26 | Discharge: 2016-07-26 | Disposition: A | Discharge status: Home / Self Care | Payer: BC Managed Care – PPO | Source: Ambulatory Visit | Attending: Obstetrics & Gynecology | Admitting: Obstetrics & Gynecology

## 2016-07-26 VITALS — BP 130/76 | HR 84 | Resp 16 | Ht 65.5 in | Wt 213.0 lb

## 2016-07-26 DIAGNOSIS — Z01419 Encounter for gynecological examination (general) (routine) without abnormal findings: Secondary | ICD-10-CM | POA: Diagnosis not present

## 2016-07-26 DIAGNOSIS — R7309 Other abnormal glucose: Secondary | ICD-10-CM | POA: Diagnosis not present

## 2016-07-26 DIAGNOSIS — E559 Vitamin D deficiency, unspecified: Secondary | ICD-10-CM

## 2016-07-26 DIAGNOSIS — B373 Candidiasis of vulva and vagina: Secondary | ICD-10-CM | POA: Insufficient documentation

## 2016-07-26 DIAGNOSIS — Z124 Encounter for screening for malignant neoplasm of cervix: Secondary | ICD-10-CM | POA: Diagnosis not present

## 2016-07-27 LAB — HEMOGLOBIN A1C
Hgb A1c MFr Bld: 5.7 % — ABNORMAL HIGH (ref <5.7)
MEAN PLASMA GLUCOSE: 117 mg/dL

## 2016-07-27 LAB — VITAMIN D 25 HYDROXY (VIT D DEFICIENCY, FRACTURES): Vit D, 25-Hydroxy: 18 ng/mL — ABNORMAL LOW (ref 30–100)

## 2016-07-29 ENCOUNTER — Telehealth: Payer: Self-pay | Admitting: *Deleted

## 2016-07-29 LAB — CYTOLOGY - PAP
ADEQUACY: ABSENT
Diagnosis: NEGATIVE
HPV: NOT DETECTED

## 2016-07-29 NOTE — Telephone Encounter (Signed)
Left voice mail to call back 

## 2016-07-29 NOTE — Telephone Encounter (Signed)
-----   Message from Jerene Bears, MD sent at 07/28/2016  9:18 PM EDT ----- Please let her know her Vit D is still low but she declines taking anything for this.  Her HbA1C is 5.7.  If she lost a little weight, this would normalize.  If she'd like to see a nutritionist, I can set this up.  I really do think she needs to re-establish care with her PCP and be seen yearly there as well.

## 2016-07-31 NOTE — Telephone Encounter (Signed)
Patient returning your call.

## 2016-08-01 NOTE — Telephone Encounter (Signed)
Routed to Stephanie

## 2016-08-01 NOTE — Telephone Encounter (Signed)
Pt notified in result note.  Closing encounter. 

## 2016-08-01 NOTE — Telephone Encounter (Signed)
Patient returned call.  I have attempted to contact this patient by phone with the following results: left message to return call to [facility] at [phone] on answering machine (mobile per [facility]).  Name verified in voicemail, advised call was follow up of previous conversation. [phone] (Mobile) *Preferred*

## 2016-08-05 ENCOUNTER — Telehealth: Payer: Self-pay | Admitting: Obstetrics & Gynecology

## 2016-08-05 MED ORDER — FLUCONAZOLE 150 MG PO TABS
ORAL_TABLET | ORAL | 0 refills | Status: DC
Start: 2016-08-05 — End: 2018-03-16

## 2016-08-05 MED ORDER — VITAMIN D (ERGOCALCIFEROL) 1.25 MG (50000 UNIT) PO CAPS: ORAL_CAPSULE | ORAL | 3 refills | Status: DC

## 2016-08-05 NOTE — Telephone Encounter (Signed)
Left message to call Ariyanna Oien at [phone]. 

## 2016-08-05 NOTE — Telephone Encounter (Signed)
Patient left message following up on prescription request. She checked with her pharmacy and it was not there.

## 2016-08-05 NOTE — Telephone Encounter (Signed)
Patient states she received results last week and rx for Diflucan and Vitamin D have not been sent to her pharmacy. Per review of results note dated 07/26/2016 rx for Diflucan 150 mg po x 1 repeat in 72 hours if symptoms persist #2 0RF sent to pharmacy on file. Rx for Vitamin D 50,000 IU weekly #12 3RF sent to pharmacy on file. Patient is agreeable. Please see results note for details.   Routing to provider for final review. Patient agreeable to disposition. Will close encounter.

## 2016-08-08 ENCOUNTER — Encounter: Payer: Self-pay | Admitting: Obstetrics & Gynecology

## 2016-08-08 ENCOUNTER — Ambulatory Visit: Payer: BC Managed Care – PPO | Admitting: Obstetrics and Gynecology

## 2016-11-14 ENCOUNTER — Telehealth: Payer: Self-pay | Admitting: *Deleted

## 2016-11-14 NOTE — Telephone Encounter (Signed)
Message left for patient to return call regarding repeat Vit D. Advised to ask for [facility]. [phone] (Mobile) *Preferred*  Patient will need orders entered when appointment scheduled.

## 2016-11-14 NOTE — Telephone Encounter (Signed)
-----   Message from Luisa DagoStephanie C Abdelrahman Nair, [place] sent at 08/01/2016 12:37 PM EDT ----- Regarding: Vit D repeat Pt needs repeat vit D 12 weeks from 08/01/16.  [place].  She will plan for appointment around 11/14/16. Out of country until 11/10/16.

## 2016-11-27 NOTE — Telephone Encounter (Signed)
Phone call to patient to follow up Vitamin D. Patient states she has not been taking Vitamin D. She has been taking the last couple of weeks. Lab appointment scheduled for 02/17/17 to recheck Vitamin D.

## 2017-02-17 ENCOUNTER — Other Ambulatory Visit (INDEPENDENT_AMBULATORY_CARE_PROVIDER_SITE_OTHER): Payer: BC Managed Care – PPO

## 2017-02-17 DIAGNOSIS — E559 Vitamin D deficiency, unspecified: Secondary | ICD-10-CM

## 2017-02-18 LAB — VITAMIN D 25 HYDROXY (VIT D DEFICIENCY, FRACTURES): VIT D 25 HYDROXY: 22.3 ng/mL — AB (ref 30.0–100.0)

## 2017-03-18 ENCOUNTER — Ambulatory Visit: Payer: BC Managed Care – PPO | Admitting: Podiatry

## 2017-06-06 IMAGING — DX DG FOOT COMPLETE 3+V*R*
3 series · 3 of 3 positions shown · non-contrast
Comparison: None.

CLINICAL DATA: Struck heel on bleachers 10 years ago. Chronic pain.

EXAM:
RIGHT FOOT COMPLETE - 3+ VIEW

[foot ap]
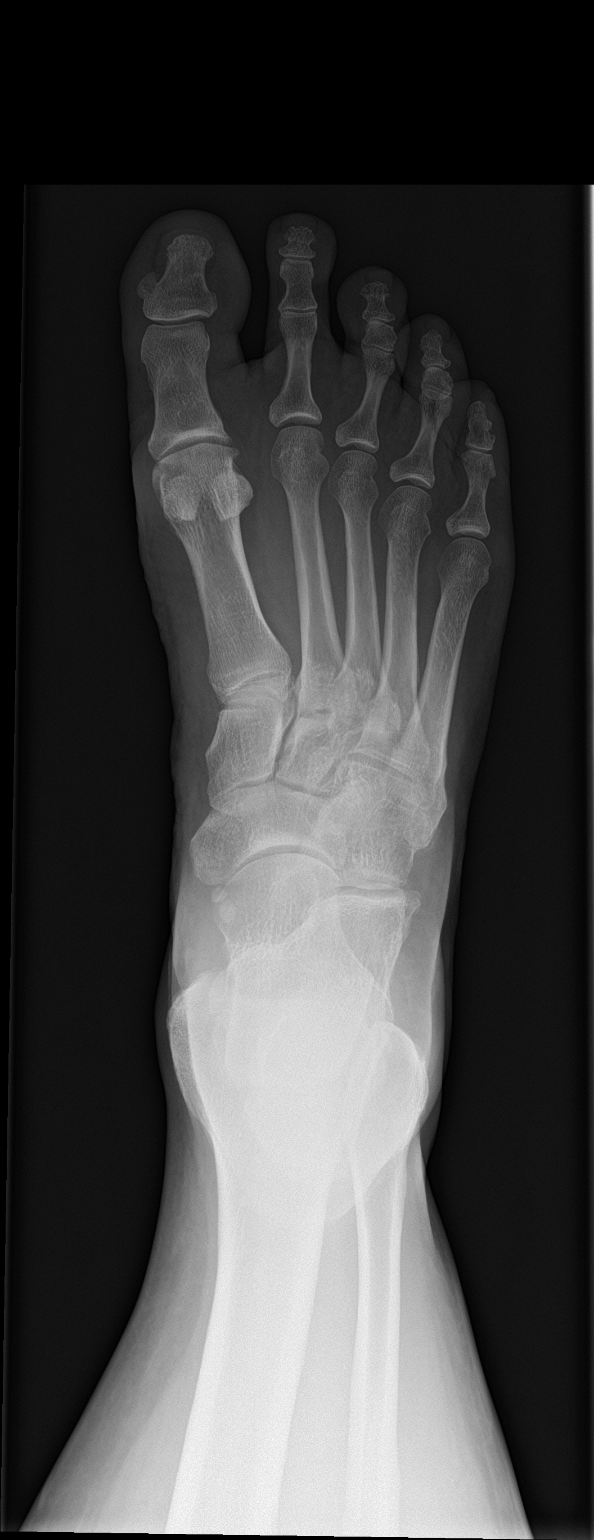

[foot obl]
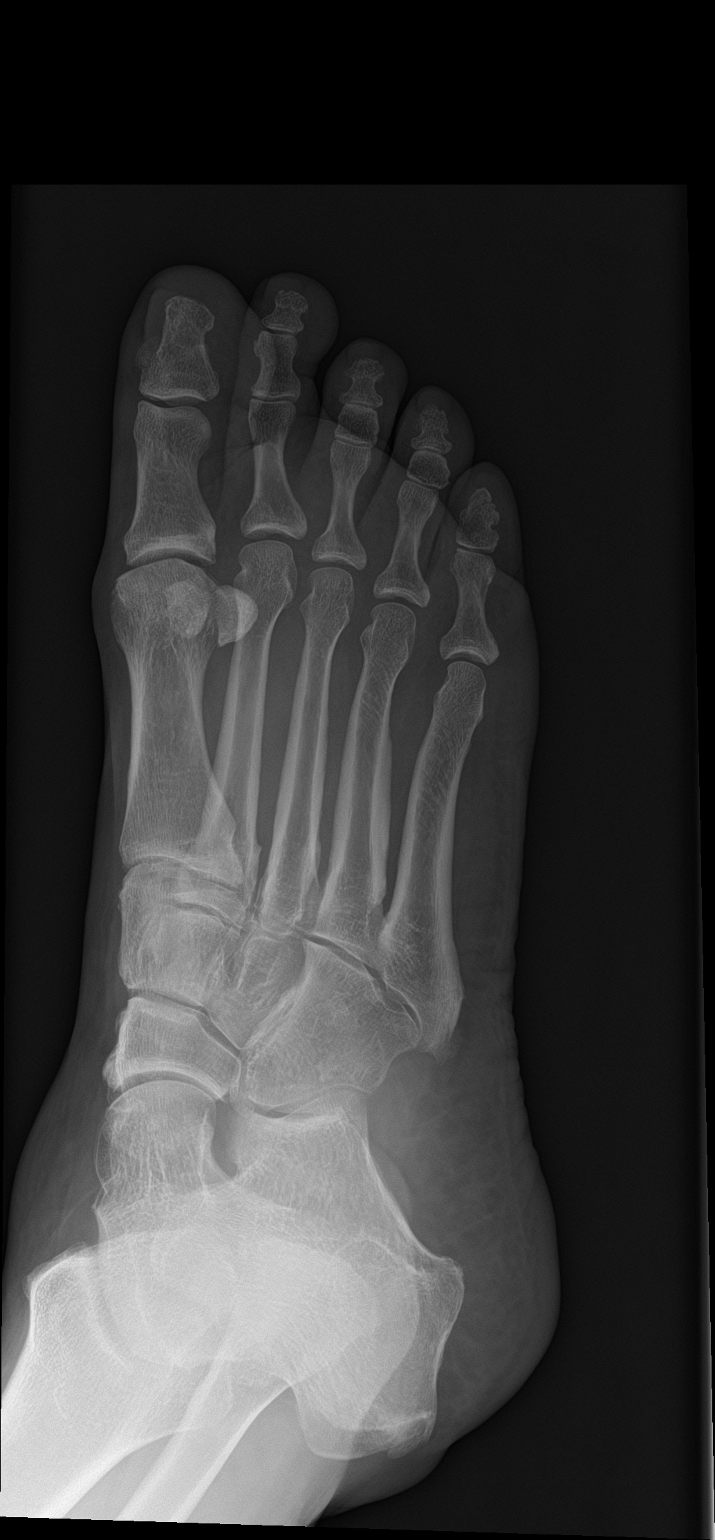

[foot lat]
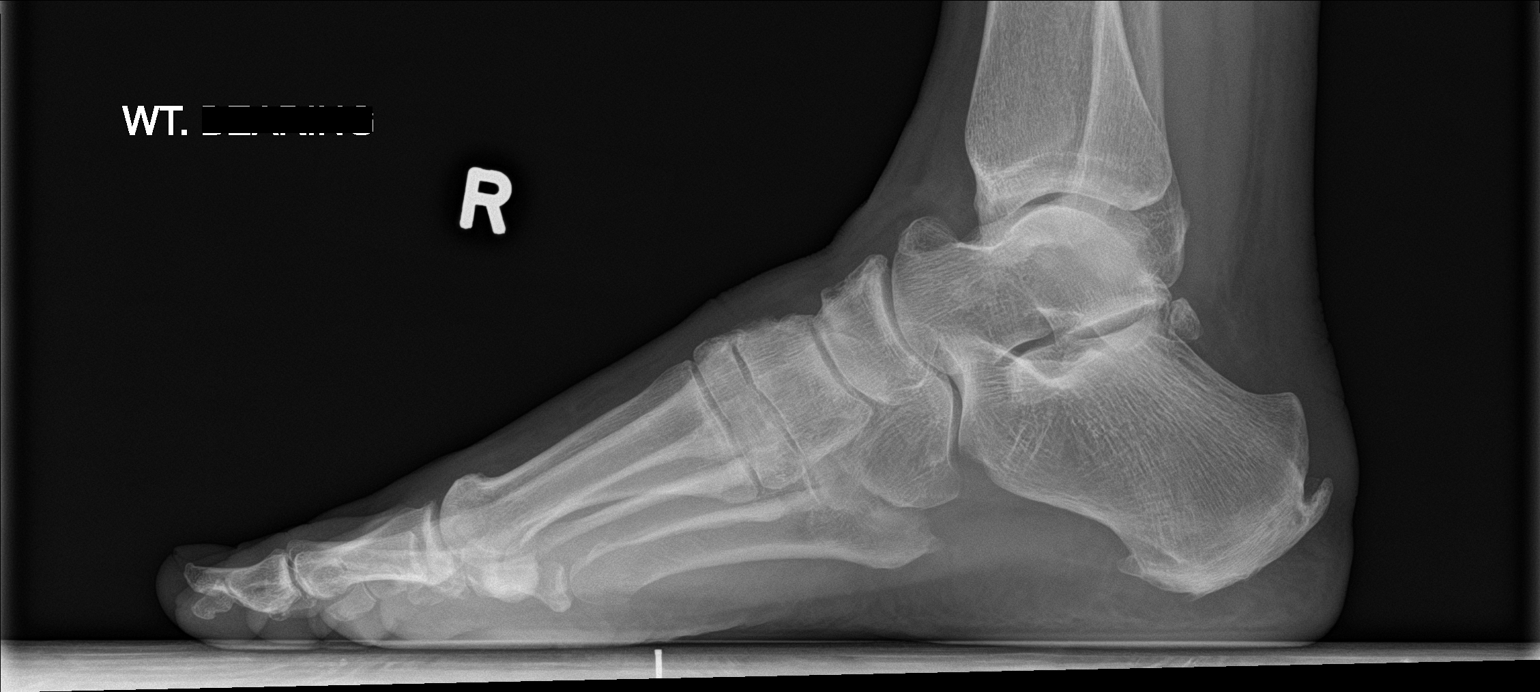

[3 of 3 positions shown; findings below may reference images not displayed]

FINDINGS: Today' s exam was primarily of the forefoot rather than the ankle.

Several small accessory navicular bones noted. No malalignment at
the Lisfranc joint. Metatarsals and phalanges intact.

There is mild spurring at the calcaneocuboid articulation. Mild
spurring of the base of the fifth metatarsal.

On the lateral weight-bearing projection, the longitudinal arch of
the foot is preserved. There is dorsal talonavicular and dorsal
Lisfranc joint spurring. Os peroneus noted. Plantar and Achilles
calcaneal spurs are present.
IMPRESSION: 1. Dorsal spurring along the Lisfranc and Chopart joint.
2. Plantar and Achilles calcaneal spurs.
3. No acute bony findings.  No pes planus.

## 2017-08-05 ENCOUNTER — Ambulatory Visit: Payer: BC Managed Care – PPO | Admitting: Podiatry

## 2017-08-11 ENCOUNTER — Ambulatory Visit: Payer: BC Managed Care – PPO | Admitting: Podiatry

## 2017-08-11 ENCOUNTER — Ambulatory Visit (INDEPENDENT_AMBULATORY_CARE_PROVIDER_SITE_OTHER): Payer: BC Managed Care – PPO

## 2017-08-11 DIAGNOSIS — M7661 Achilles tendinitis, right leg: Secondary | ICD-10-CM

## 2017-08-11 DIAGNOSIS — M7731 Calcaneal spur, right foot: Secondary | ICD-10-CM

## 2017-08-11 NOTE — Patient Instructions (Signed)
Pre-Operative Instructions  Congratulations, you have decided to take an important step towards improving your quality of life.  You can be assured that the doctors and staff at [facility] will be with you every step of the way.  Here are some important things you should know:  1. Plan to be at the surgery center/hospital at least 1 (one) hour prior to your scheduled time, unless otherwise directed by the surgical center/hospital staff.  You must have a responsible adult accompany you, remain during the surgery and drive you home.  Make sure you have directions to the surgical center/hospital to ensure you arrive on time. 2. If you are having surgery at [facility] or [facility] hospitals, you will need a copy of your medical history and physical form from your family physician within one month prior to the date of surgery. We will give you a form for your primary physician to complete.  3. We make every effort to accommodate the date you request for surgery.  However, there are times where surgery dates or times have to be moved.  We will contact you as soon as possible if a change in schedule is required.   4. No aspirin/ibuprofen for one week before surgery.  If you are on aspirin, any non-steroidal anti-inflammatory medications (Mobic, Aleve, Ibuprofen) should not be taken seven (7) days prior to your surgery.  You make take Tylenol for pain prior to surgery.  5. Medications - If you are taking daily heart and blood pressure medications, seizure, reflux, allergy, asthma, anxiety, pain or diabetes medications, make sure you notify the surgery center/hospital before the day of surgery so they can tell you which medications you should take or avoid the day of surgery. 6. No food or drink after midnight the night before surgery unless directed otherwise by surgical center/hospital staff. 7. No alcoholic beverages 24-hours prior to surgery.  No smoking 24-hours prior or 24-hours after  surgery. 8. Wear loose pants or shorts. They should be loose enough to fit over bandages, boots, and casts. 9. Don't wear slip-on shoes. Sneakers are preferred. 10. Bring your boot with you to the surgery center/hospital.  Also bring crutches or a walker if your physician has prescribed it for you.  If you do not have this equipment, it will be provided for you after surgery. 11. If you have not been contacted by the surgery center/hospital by the day before your surgery, call to confirm the date and time of your surgery. 12. Leave-time from work may vary depending on the type of surgery you have.  Appropriate arrangements should be made prior to surgery with your employer. 13. Prescriptions will be provided immediately following surgery by your doctor.  Fill these as soon as possible after surgery and take the medication as directed. Pain medications will not be refilled on weekends and must be approved by the doctor. 14. Remove nail polish on the operative foot and avoid getting pedicures prior to surgery. 15. Wash the night before surgery.  The night before surgery wash the foot and leg well with water and the antibacterial soap provided. Be sure to pay special attention to beneath the toenails and in between the toes.  Wash for at least three (3) minutes. Rinse thoroughly with water and dry well with a towel.  Perform this wash unless told not to do so by your physician.  Enclosed: 1 Ice pack (please put in freezer the night before surgery)   1 Hibiclens skin cleaner     Pre-op instructions  If you have any questions regarding the instructions, please do not hesitate to call our office.  [place]: [address], [place], [place] [address] -- [phone]  [place]: [address] [place], [place] [address] -- [phone]  [place]: [address]  [place], [place] [address] -- [phone]  [place]: [address], [address], [place], [place] [address] -- [phone]  Website:  [email or web address] 

## 2017-08-13 NOTE — Progress Notes (Signed)
Subjective: Stephanie Merritt presents the office today for concerns of chronic right heel pain, bone spur.  She is attempted numerous conservative treatments but a significant improvement this point to discuss surgery to help decrease her pain.  She states that she continues to have pain on a regular basis which is limiting her activity level.  She is attempted numerous treatments without any improvement at this point she would like to go to discuss surgery although she is nervous about doing surgery. Denies any systemic complaints such as fevers, chills, nausea, vomiting. No acute changes since last appointment, and no other complaints at this time.   Objective: AAO x3, NAD DP/PT pulses palpable bilaterally, CRT less than 3 seconds To the posterior aspect of the right heel is a prominent retrocalcaneal exostosis palpable and there is mild erythema from around inside of her shoes but there is no increase in warmth or skin breakdown.  There is tenderness to palpation directly along the bone spur.  There is tenderness to the distal portion of the Achilles tendon along the insertion into the calcaneus and [name] test is negative.  Is no significant equinus and she does appear to have adequate dorsiflexion.  There is no pain with lateral compression of the calcaneus.  There is no other areas of tenderness identified to bilateral lower extremities. No open lesions or pre-ulcerative lesions.  No pain with calf compression, swelling, warmth, erythema  Assessment: Prominent retrocalcaneal exostosis, tendinitis right posterior heel  Plan: -All treatment options discussed with the patient including all alternatives, risks, complications.  -I did review the x-rays with her.  We discussed the conservative as well as surgical treatment options.  After long discussion she elected to proceed with surgery.  We discussed removal of the bone spur to the posterior heel as well as Tenolysis of the Achilles tendon with  reattachment of the tendon with bone anchors.  We discussed the surgery as well as the postoperative course as well as risks and complications.  Also discussed this is not a guarantee resolution of symptoms. -The incision placement as well as the postoperative course was discussed with the patient. I discussed risks of the surgery which include, but not limited to, infection, bleeding, pain, swelling, need for further surgery, delayed or nonhealing, painful or ugly scar, numbness or sensation changes, over/under correction, recurrence, transfer lesions, further deformity, hardware failure, DVT/PE, loss of toe/foot. Patient understands these risks and wishes to proceed with surgery. The surgical consent was reviewed with the patient all 3 pages were signed. No promises or guarantees were given to the outcome of the procedure. All questions were answered to the best of my ability. Before the surgery the patient was encouraged to call the office if there is any further questions. The surgery will be performed at the [facility] on an outpatient basis. -She will do this on October 01, 2017 -Patient encouraged to call the office with any questions, concerns, change in symptoms.   Vivi Barrack DPM

## 2017-09-15 ENCOUNTER — Encounter: Payer: Self-pay | Admitting: *Deleted

## 2017-09-15 ENCOUNTER — Telehealth: Payer: Self-pay | Admitting: *Deleted

## 2017-09-15 NOTE — Telephone Encounter (Addendum)
"  I'm a nervous wreck.  I'm at the point of canceling the surgery and dealing with the pain for the rest of my life.  I called my insurance and they said no one had called to get authorization for my surgery yet.  I haven't heard anything from the surgery center yet.  I have several questions for Dr. Ardelle AntonWagoner.  Dr. Ardelle AntonWagoner said if I had any questions to call him."  I can help you with your questions.  "I'd rather talk to him, I'm am sorry worried about this surgery."  I can help you with some of your questions.  You will not hear from the surgical center until a day or two prior to your surgery date.  You have [employer].  They do not require authorization for surgery.  "I called them and they were asking me for the codes.  I can't afford to pay for this surgery out of pocket.  I need to know for sure."  All insurance companies say the disclaimer that it has to be medically necessary.  We will submit our clinical notes that support that your surgery was medically necessary.  "I'd rather call myself to verify.  Can you give me the codes?"  Yes, the codes are [id number] and 28118.  "Can you email those to me.  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  He had told me that I would not have an ugly scar but when I was reading my papers it said there could be one.  I know he said that the achilles would have to be detached in order to get to the spur.  I am worried about that as well.  Will this cause problems with my achilles on down the road?  I do a lot of hiking and mountain climbing.  Will I still be able to do that?"  I will give Dr. Ardelle AntonWagoner your questions.  He may want you to come back in for another consultation.    I sent the patient an email in MyChart about the procedure codes.

## 2017-09-16 ENCOUNTER — Telehealth: Payer: Self-pay | Admitting: *Deleted

## 2017-09-17 NOTE — Telephone Encounter (Signed)
I called the patient today to discuss. She states she would like to get back to a better quality of life. She has not been able to dance in 2 years and do the things she likes because she walks with a limp due to the pain. We discussed the surgery in more detail. She wants to proceed and I encouraged her to either call or send me a MyChart message if she has any additional questions or concerns. I also offered her a second opinion with another physician but she declined.

## 2017-09-17 NOTE — Telephone Encounter (Signed)
"  I got a message from [facility] telling me to go on-line and send them a list of my medication.  I didn't know if I should respond or not.  I wasn't sure who it may be coming from."  It's probably a message from the surgical center.  Have you registered with them yet on-line.  "No, I haven't done it yet.  How do I go about doing this?"  We gave you their brochure.  Once you open the brochure up, there will be a dark blue page.  On that page, you will see instructions about how to go on-line to register.  "Okay, thank you."

## 2017-09-30 ENCOUNTER — Telehealth: Payer: Self-pay | Admitting: *Deleted

## 2017-09-30 NOTE — Telephone Encounter (Signed)
"  I just need someone to call me.  I have surgery tomorrow.  I'm supposed to be at [facility] at 5:45 in the morning.  They said someone would call me the day before my surgery.  I hadn't heard from anyone.  So I thought I'd better call because it's after 12 pm.  I was calling to check and see if someone needed to call me or if surgery was still scheduled or what is going on.  Please call me back."  I'm returning your call.  You are still scheduled for surgery tomorrow.  "I just wanted to make sure.  So I know I'm not to eat anything two hours prior to surgery."  You are not to eat or drink anything after midnight the night before surgery.  "I can't have anything to drink either?"  No, you are to refrain from eating or drinking anything after midnight.  "Okay, I just wanted to make sure."

## 2017-10-01 ENCOUNTER — Encounter: Payer: Self-pay | Admitting: Podiatry

## 2017-10-01 ENCOUNTER — Other Ambulatory Visit: Payer: Self-pay | Admitting: Podiatry

## 2017-10-01 ENCOUNTER — Telehealth: Payer: Self-pay | Admitting: *Deleted

## 2017-10-01 DIAGNOSIS — M216X1 Other acquired deformities of right foot: Secondary | ICD-10-CM | POA: Diagnosis not present

## 2017-10-01 DIAGNOSIS — M7731 Calcaneal spur, right foot: Secondary | ICD-10-CM | POA: Diagnosis not present

## 2017-10-01 DIAGNOSIS — M7661 Achilles tendinitis, right leg: Secondary | ICD-10-CM | POA: Diagnosis not present

## 2017-10-01 MED ORDER — CEPHALEXIN 500 MG PO CAPS: 500.0000 mg | ORAL_CAPSULE | Freq: Three times a day (TID) | ORAL | 0 refills | Status: DC

## 2017-10-01 MED ORDER — OXYCODONE-ACETAMINOPHEN 5-325 MG PO TABS
1.0000 | ORAL_TABLET | Freq: Four times a day (QID) | ORAL | 0 refills | Status: DC | PRN
Start: 2017-10-01 — End: 2018-03-16

## 2017-10-01 MED ORDER — PROMETHAZINE HCL 25 MG PO TABS
25.0000 mg | ORAL_TABLET | Freq: Three times a day (TID) | ORAL | 0 refills | Status: DC | PRN
Start: 2017-10-01 — End: 2018-03-16

## 2017-10-01 NOTE — Telephone Encounter (Signed)
"  I'm calling from [facility].  I have a prescription for Stephanie Merritt.  I need her diagnosis."  She had surgery today.  She had a Tenolysis and Calcaneal Ostectomy.  "Can you give it to me in layman's terms please?"  She had a tear and a heel spur.  "Okay, that's all I needed."

## 2017-10-01 NOTE — Progress Notes (Signed)
Pre-operative Note  Patient presents to the [facility] today for surgical intervention of the RIGHT foot for heel spur resection and achilles tendon debridement/repair. The surgical consent was reviewed with the patient and we discussed the procedure as well as the postoperative course. I again discussed all alternatives, risks, complications. I answered all of their questions to the best of my ability and they wish to proceed with surgery. No promises or guarantees were given as to the outcome of the surgery.   The surgical consent was signed.   Patient is NPO since midnight.  The patient does not have have a history of blood clots or bleeding disorders. She will do an 81mg  ASA postop.   Sent postop medications to her pharmacy.   Her husband and mother are at bedside. We discussed the surgery and postop course. No further questions.   Ovid CurdMatthew Ismeal Heider, DPM [facility]

## 2017-10-02 ENCOUNTER — Telehealth: Payer: Self-pay | Admitting: *Deleted

## 2017-10-02 NOTE — Telephone Encounter (Signed)
Called and spoke with the patient and the patient is doing ok and took a percocet at 1 am this morning and felt a little nauseated and took the medicine for nausea and have felt a little better and did ice and elevate and the pain right now is good and I did take two tablets of ibuprofen and I stated to call the office if any concerns or questions and that we would see the patient next week. Stephanie StanleyLisa

## 2017-10-06 ENCOUNTER — Ambulatory Visit (INDEPENDENT_AMBULATORY_CARE_PROVIDER_SITE_OTHER): Payer: BC Managed Care – PPO

## 2017-10-06 ENCOUNTER — Ambulatory Visit (INDEPENDENT_AMBULATORY_CARE_PROVIDER_SITE_OTHER): Payer: BC Managed Care – PPO | Admitting: Podiatry

## 2017-10-06 DIAGNOSIS — M7731 Calcaneal spur, right foot: Secondary | ICD-10-CM

## 2017-10-06 DIAGNOSIS — Z9889 Other specified postprocedural states: Secondary | ICD-10-CM

## 2017-10-06 NOTE — Progress Notes (Signed)
Dg f 

## 2017-10-12 NOTE — Progress Notes (Signed)
   Subjective:  Patient presents today status post retrocalcaneal exostectomy RLE. DOS: 10/01/17. She states she is doing well. She has been using the knee scooter with no issues. She denies any new complaints at this time. Patient is here for further evaluation and treatment.    Past Medical History:  Diagnosis Date  . Anemia   . Bone spur of right foot   . Seasonal allergies       Objective/Physical Exam Neurovascular status intact. Cast intact. Capillary refill immediate to all digits.   Radiographic Exam:  Orthopedic hardware and osteotomies sites appear to be stable with routine healing.  Assessment: 1. s/p retrocalcaneal exostectomy of the RLE. DOS: 10/01/17   Plan of Care:  1. Patient was evaluated. X-rays reviewed 2. Cast left intact.  3. Continue nonweightbearing with knee scooter.  4. Return to clinic in 1 week with Dr. Ardelle AntonWagoner.    Felecia ShellingBrent M. Evans, DPM [facility]  Dr. Felecia ShellingBrent M. Evans, DPM    [address]                                        [place], [place] [id number]                Office [phone]  Fax [phone]

## 2017-10-13 ENCOUNTER — Encounter: Payer: Self-pay | Admitting: Podiatry

## 2017-10-13 ENCOUNTER — Ambulatory Visit (INDEPENDENT_AMBULATORY_CARE_PROVIDER_SITE_OTHER): Payer: BC Managed Care – PPO | Admitting: Podiatry

## 2017-10-13 DIAGNOSIS — M7661 Achilles tendinitis, right leg: Secondary | ICD-10-CM | POA: Diagnosis not present

## 2017-10-13 DIAGNOSIS — M7731 Calcaneal spur, right foot: Secondary | ICD-10-CM

## 2017-10-13 DIAGNOSIS — Z9889 Other specified postprocedural states: Secondary | ICD-10-CM

## 2017-10-14 ENCOUNTER — Encounter

## 2017-10-14 ENCOUNTER — Ambulatory Visit: Payer: BC Managed Care – PPO | Admitting: Obstetrics & Gynecology

## 2017-10-15 NOTE — Progress Notes (Signed)
Subjective: Stephanie Merritt is a 52 y.o. is seen today in office s/p right retrocalcaneal exostectomy with Achilles tendon lysis performed on October 01, 2017  They state their pain is controlled.  She is not taking any narcotic pain medication.  She said her cast is comfortable and she said no issues.  She is been nonweightbearing.  No recent falls.  Denies any systemic complaints such as fevers, chills, nausea, vomiting. No calf pain, chest pain, shortness of breath.   Objective: General: No acute distress, AAOx3  DP/PT pulses palpable 2/4, CRT < 3 sec to all digits.  Protective sensation intact. Motor function intact.  Cast intact to the right lower extremity. RIGHT foot: Incision is well coapted without any evidence of dehiscence and sutures, staples are intact. There is a faint rim of erythema likely more from inflammation as opposed to infection however there is no ascending cellulitis, fluctuance, crepitus, malodor, drainage/purulence. There is mild edema around the surgical site. There is no significant pain along the surgical site.  Thompson test is negative and the Achilles tendon appears to be intact.  Overall incision appears to be healing well without any signs of infection. No other areas of tenderness to bilateral lower extremities.  No other open lesions or pre-ulcerative lesions.  No pain with calf compression, swelling, warmth, erythema.       Assessment and Plan:  Status post right heel surgery, doing well with no complications   -Treatment options discussed including all alternatives, risks, and complications -I reviewed the x-rays with her again today.  Antibiotic ointment was applied to the incision followed by dry sterile dressing.  A well-padded below-knee fiberglass cast was applied making sure to pad all bony prominences.  The cast was fitting appropriately when she left the office today.  Continue nonweightbearing and ice and elevation. -Pain medication as  needed. -Monitor for any clinical signs or symptoms of infection and DVT/PE and directed to call the office immediately should any occur or go to the ER. -Follow-up as scheduled for cast removal and likely suture removal (LEAVE THE STAPLES INTACT) or sooner if any problems arise. In the meantime, encouraged to call the office with any questions, concerns, change in symptoms.   Ovid CurdMatthew Wagoner, DPM

## 2017-10-20 ENCOUNTER — Ambulatory Visit (INDEPENDENT_AMBULATORY_CARE_PROVIDER_SITE_OTHER): Payer: BC Managed Care – PPO | Admitting: Podiatry

## 2017-10-20 VITALS — Temp 96.6°F

## 2017-10-20 DIAGNOSIS — Z9889 Other specified postprocedural states: Secondary | ICD-10-CM

## 2017-10-20 DIAGNOSIS — M7731 Calcaneal spur, right foot: Secondary | ICD-10-CM

## 2017-10-20 DIAGNOSIS — M7661 Achilles tendinitis, right leg: Secondary | ICD-10-CM

## 2017-10-21 NOTE — Progress Notes (Signed)
Subjective: Stephanie Merritt is a 52 y.o. is seen today in office s/p right retrocalcaneal exostectomy with Achilles tendon lysis performed on October 01, 2017.  She states that she is doing well she denies any pain at the surgical site.  She did feel a cast above her ankle little bit but other than that she is been doing well.  She is remained nonweightbearing she denies any recent injury or falls.  She has no other concerns today.  Denies any systemic complaints such as fevers, chills, nausea, vomiting. No calf pain, chest pain, shortness of breath.   Objective: General: No acute distress, AAOx3  DP/PT pulses palpable 2/4, CRT < 3 sec to all digits.  Protective sensation intact. Motor function intact.  Cast intact to the right lower extremity. RIGHT foot: Incision is well coapted without any evidence of dehiscence and sutures, staples are intact.  Overall incision appears to be healing well.  There is no significant motion across the incision site.  There is decreased erythema and there is no significant discomfort to palpation.  Thompson test is negative and the Achilles tendon appears to be intact.  There is no pain with lateral compression of calcaneus.  Unable to get her ankle to 90 degrees without any discomfort. No other open lesions or pre-ulcerative lesions.  No pain with calf compression, swelling, warmth, erythema.         Assessment and Plan:  Status post right heel surgery, doing well with no complications   -Treatment options discussed including all alternatives, risks, and complications -Sutures removed today without complications.  Staples intact.  Antibiotic ointment and a bandage was applied to the incision.  We did place her into a cam boot today this was comfortable for her and she states it felt better than the cast.  Continue nonweightbearing.  Continue ice elevate.  Pain medication as needed but she has not been needing this. -Monitor for any clinical signs or symptoms of  infection and DVT/PE and directed to call the office immediately should any occur or go to the ER. -Follow-up as scheduled for cast removal and likely staple removal or sooner if any problems arise. In the meantime, encouraged to call the office with any questions, concerns, change in symptoms.   Ovid CurdMatthew Wagoner, DPM

## 2017-10-29 ENCOUNTER — Ambulatory Visit (INDEPENDENT_AMBULATORY_CARE_PROVIDER_SITE_OTHER): Payer: Self-pay | Admitting: Podiatry

## 2017-10-29 ENCOUNTER — Other Ambulatory Visit: Payer: BC Managed Care – PPO

## 2017-10-29 DIAGNOSIS — M7661 Achilles tendinitis, right leg: Secondary | ICD-10-CM

## 2017-10-29 DIAGNOSIS — Z9889 Other specified postprocedural states: Secondary | ICD-10-CM

## 2017-10-29 DIAGNOSIS — M7731 Calcaneal spur, right foot: Secondary | ICD-10-CM

## 2017-10-29 NOTE — Progress Notes (Signed)
Subjective: Stephanie LaurenceCheryl L Merritt is a 52 y.o. is seen today in office s/p right retrocalcaneal exostectomy with Achilles tendon lysis performed on October 01, 2017.  Overall she states that she is doing well.  She has not had any significant pain however she does report a fall earlier this week.  She states that she may have twisted her ankle somewhat.  She was not wearing the boot when she fell.  She is wearing a night splint as the boot was uncomfortable for her to wear so she decided to sleep in her night splint for immobilization.  She states that she has no area of tenderness other than a little bit of tenderness at the surgical site.  She denies any increase in swelling since the fall.  She presents today for possible staple removal.  She has no other concerns. Denies any systemic complaints such as fevers, chills, nausea, vomiting. No calf pain, chest pain, shortness of breath.   Objective: General: No acute distress, AAOx3  DP/PT pulses palpable 2/4, CRT < 3 sec to all digits.  Protective sensation intact. Motor function intact.  Cast intact to the right lower extremity. RIGHT foot: Incision is well coapted without any evidence of dehiscence and staples are intact.  There is no area pinpoint bony tenderness or pain to vibratory sensation.  There is very minimal edema in the only area of swelling is along the surgical site which appears to be improved as well.  The incision is healing well.  There is minimal discomfort in the Achilles tendon however Thompson test is negative and the Achilles tendon appears to be intact.  Is no pain with lateral compression of the calcaneus. There is no surrounding erythema, ascending cellulitis there is no drainage or pus or any clinical signs of infection present at this time.  No other open lesions or pre-ulcerative lesions.  No pain with calf compression, swelling, warmth, erythema.   Assessment and Plan:  Status post right heel surgery, doing well with no  complications   -Treatment options discussed including all alternatives, risks, and complications -Although she reports a fall there is no other areas of tenderness.  The Achilles tendon appears to be intact. -They remove the majority disabled but the couple intact this for reinforcement although it appears to be healing well.  Antibiotic ointment and a bandage was applied.  She can start to shower tomorrow.  On her slowly start to transition to him a little bit of weight on her foot and be partial weightbearing over the next 2 weeks.  We discussed range of motion exercises for her ankle to help with strength as well.  Continue ice and elevate as well as well as wearing the cam boot at all times.  I will see her back next week to remove the remainder of the stitches.  We will start physical therapy in 2 weeks.Monitor for any clinical signs or symptoms of infection and directed to call the office immediately should any occur or go to the ER.  Vivi BarrackMatthew R Georgi Tuel DPM

## 2017-11-06 ENCOUNTER — Other Ambulatory Visit: Payer: BC Managed Care – PPO

## 2017-11-10 ENCOUNTER — Ambulatory Visit (INDEPENDENT_AMBULATORY_CARE_PROVIDER_SITE_OTHER): Payer: Self-pay | Admitting: Podiatry

## 2017-11-10 DIAGNOSIS — M7661 Achilles tendinitis, right leg: Secondary | ICD-10-CM

## 2017-11-10 DIAGNOSIS — M7731 Calcaneal spur, right foot: Secondary | ICD-10-CM

## 2017-11-10 DIAGNOSIS — Z9889 Other specified postprocedural states: Secondary | ICD-10-CM

## 2017-11-12 NOTE — Progress Notes (Signed)
Subjective: Reather LaurenceCheryl L Symmonds is a 52 y.o. is seen today in office s/p right retrocalcaneal exostectomy with Achilles tendon lysis performed on October 01, 2017.  She presents today to remove the rest of the staples.  Overall she is doing well.  She has been walking the cam boot with use of crutches.  She states that she is not been moving the ankle much because it feels tight.  No recent injury or fall since I last saw her she has no other concerns today. Denies any systemic complaints such as fevers, chills, nausea, vomiting. No calf pain, chest pain, shortness of breath.   Objective: General: No acute distress, AAOx3  DP/PT pulses palpable 2/4, CRT < 3 sec to all digits.  Protective sensation intact. Motor function intact.  Cast intact to the right lower extremity. RIGHT foot: Incision is well coapted without any evidence of dehiscence and staples are intact.  Overall incision appears to be healing well.  There is a small area of scab on the more distal portion of starting to scab over this appears to be superficial.  She states there is a scab on the area which should come off last night.  There is no drainage or pus.  Mild edema there is no erythema or warmth.  Overall tendon appears to be intact and strength appears to be adequate at this point postoperatively.  There is no pain with lateral compression of the calcaneus. No pain with calf compression, swelling, warmth, erythema.     Assessment and Plan:  Status post right heel surgery, doing well with no complications   -Treatment options discussed including all alternatives, risks, and complications -Recommend to the sutures removed without complications.  He is a small amount of Betadine on the distal portion of the incision with a scab was at daily.  There is any worsening to call me immediately.  Monitoring signs or symptoms of infection.  Continue cam boot at all times and gradually increase weightbearing status.  Physical therapy.  Return  in about 2 weeks (around 11/24/2017) for Mykaylah Ballman patient.  Vivi BarrackMatthew R Sehaj Mcenroe DPM

## 2017-11-24 ENCOUNTER — Encounter: Payer: BC Managed Care – PPO | Admitting: Podiatry

## 2017-11-28 ENCOUNTER — Ambulatory Visit (INDEPENDENT_AMBULATORY_CARE_PROVIDER_SITE_OTHER): Payer: BC Managed Care – PPO | Admitting: Podiatry

## 2017-11-28 ENCOUNTER — Encounter: Payer: Self-pay | Admitting: Podiatry

## 2017-11-28 DIAGNOSIS — M7731 Calcaneal spur, right foot: Secondary | ICD-10-CM

## 2017-11-28 DIAGNOSIS — Z9889 Other specified postprocedural states: Secondary | ICD-10-CM

## 2017-12-01 NOTE — Progress Notes (Signed)
Subjective: Stephanie Merritt is a 52 y.o. is seen today in office s/p right retrocalcaneal exostectomy with Achilles tendon lysis performed on October 01, 2017.  Overall she states that she is doing better.  She can increase her nighttime she has been walking.  She presents today wearing a sandal of an open back shoe.  She says the incision is healed.  She has not noticed any drainage or pus and she denies any significant increase in swelling.  She gets some swelling at surgical site as well as the ankle but overall intermittent.  No recent falls or injury.  She has no other concerns today.Denies any systemic complaints such as fevers, chills, nausea, vomiting. No calf pain, chest pain, shortness of breath.   Objective: General: No acute distress, AAOx3  DP/PT pulses palpable 2/4, CRT < 3 sec to all digits.  Protective sensation intact. Motor function intact.  Cast intact to the right lower extremity. RIGHT foot: Incision is well coapted without any evidence of dehiscence and incision appears to be well-healed.  There is 2 areas of what appears to be scab is come off a new healthy skin present on the distal portion of the incision but that the incision is healed.  There is no erythema there is no increased warmth.  There is a "knotty" type feeling along the Achilles tendon but overall Thompson test is negative and the Achilles tendon appears to be intact.  Mild swelling to this area.  No pain with lateral compression of calcaneus.  No other area of tenderness. No pain with calf compression, swelling, warmth, erythema.     Assessment and Plan:  Status post right heel surgery, doing well with no complications   -Treatment options discussed including all alternatives, risks, and complications -Overall she is doing better.  I want to continue with physical therapy as well as home exercises.  Continue to wear supportive shoes.  She is actually wearing a sandal today.  Continue ice the area as well.   Anti-inflammatories as needed.  She agrees this plan has no further questions or concerns today.  Return in about 4 weeks (around 12/26/2017).  Vivi BarrackMatthew R Yasmina Chico DPM

## 2018-01-08 ENCOUNTER — Ambulatory Visit (INDEPENDENT_AMBULATORY_CARE_PROVIDER_SITE_OTHER): Payer: BC Managed Care – PPO

## 2018-01-08 ENCOUNTER — Ambulatory Visit (INDEPENDENT_AMBULATORY_CARE_PROVIDER_SITE_OTHER): Payer: BC Managed Care – PPO | Admitting: Podiatry

## 2018-01-08 ENCOUNTER — Encounter: Payer: Self-pay | Admitting: Podiatry

## 2018-01-08 ENCOUNTER — Other Ambulatory Visit: Payer: Self-pay | Admitting: Podiatry

## 2018-01-08 DIAGNOSIS — M7661 Achilles tendinitis, right leg: Secondary | ICD-10-CM

## 2018-01-08 DIAGNOSIS — M7731 Calcaneal spur, right foot: Secondary | ICD-10-CM

## 2018-01-08 DIAGNOSIS — M79671 Pain in right foot: Secondary | ICD-10-CM

## 2018-01-08 DIAGNOSIS — Z9889 Other specified postprocedural states: Secondary | ICD-10-CM

## 2018-01-08 MED ORDER — DICLOFENAC SODIUM 1 % TD GEL: 2.0000 g | Freq: Four times a day (QID) | TRANSDERMAL | 2 refills | Status: DC

## 2018-01-11 NOTE — Progress Notes (Signed)
Subjective: Stephanie Merritt is a 52 y.o. is seen today in office s/p right retrocalcaneal exostectomy with Achilles tendolysis performed on October 01, 2017.  She has no other physical therapy and this is been very helpful for her.  She is working on trying to get on her tiptoes as well as starting to run but she has not yet done this.  She states that she still some difficulty with steps going up and down.  She has notices some one small area at the back of the heel that does get inflamed when doing a lot of exercise.  Overall she has not felt well this week she has had other issues and she has not gone to physical therapy and since resting at this area the back of the heel has not been causing any issues and is not causing any problems today but she does point to one spot in the posterior aspect heel that gets inflamed and painful at times She has no other concerns today.Denies any systemic complaints such as fevers, chills, nausea, vomiting. No calf pain, chest pain, shortness of breath.   Objective: General: No acute distress, AAOx3  DP/PT pulses palpable 2/4, CRT < 3 sec to all digits.  Protective sensation intact. Motor function intact.  Cast intact to the right lower extremity. RIGHT foot: Incision is well coapted without any evidence of dehiscence and incision appears to be well-healed.  There is one area just more lateral to the incision that causes some discomfort and she states that occasionally gets swollen and red with activity.  She is currently not having the symptoms today.  Thompson test is negative but overall the Achilles tendon appears to be intact.  No significant bony prominence is present.  No pain with lateral compression of calcaneus.  No other areas of tenderness. No pain with calf compression, swelling, warmth, erythema.         Assessment and Plan:  Status post right heel surgery  -Treatment options discussed including all alternatives, risks, and complications -X-rays  were obtained reviewed.  A skin marker was utilized to identify the area where she gets some discomfort.  No significant bone spurs present this area there is no evidence of acute fracture. -Only continue physical therapy.  She states that she had another week of therapy before she can be discharged.  Only continue with this and then continue home therapy as well.  Gradually increase activity level.  Continue to offload the posterior heel with try to wear shoe.  She is wearing a regular shoe today.  I prescribed Voltaren gel which she can use topically to the heel as well.  Follow-up in about 6 weeks or sooner if needed.  Vivi Barrack DPM

## 2018-02-19 ENCOUNTER — Ambulatory Visit: Payer: BC Managed Care – PPO | Admitting: Podiatry

## 2018-03-02 ENCOUNTER — Encounter: Payer: Self-pay | Admitting: Podiatry

## 2018-03-02 ENCOUNTER — Ambulatory Visit (INDEPENDENT_AMBULATORY_CARE_PROVIDER_SITE_OTHER): Payer: Self-pay | Admitting: Podiatry

## 2018-03-02 DIAGNOSIS — M7731 Calcaneal spur, right foot: Secondary | ICD-10-CM

## 2018-03-02 DIAGNOSIS — M7661 Achilles tendinitis, right leg: Secondary | ICD-10-CM

## 2018-03-02 DIAGNOSIS — B351 Tinea unguium: Secondary | ICD-10-CM

## 2018-03-02 MED ORDER — DICLOFENAC SODIUM 1 % TD GEL: 2.0000 g | Freq: Four times a day (QID) | TRANSDERMAL | 2 refills | Status: DC

## 2018-03-04 ENCOUNTER — Telehealth: Payer: Self-pay

## 2018-03-04 NOTE — Telephone Encounter (Signed)
Rx for diclofenac 1% gel faxed to [facility]

## 2018-03-09 ENCOUNTER — Telehealth: Payer: Self-pay | Admitting: Podiatry

## 2018-03-09 NOTE — Progress Notes (Signed)
Subjective: Stephanie Merritt is a 52 y.o. is seen today in office s/p right retrocalcaneal exostectomy with Achilles tendolysis performed on October 01, 2017.  She states overall she is doing better.  She still gets one area in the back of her heel which is causing some occasional discomfort and gets red at times but denies any open sores or drainage and overall the incisions are doing well.  She is been trying to break up scar tissue on the incision.  She is back to wearing regular shoes and she is working without any significant discomfort.  She is continue with home therapy on her own.  She did not get the Voltaren gel.  He also has some concern of a nail fungus.  Denies any pain in the nails and denies any redness or drainage or any swelling.  She has no other concerns today. Denies any systemic complaints such as fevers, chills, nausea, vomiting. No calf pain, chest pain, shortness of breath.   Objective: General: No acute distress, AAOx3  DP/PT pulses palpable 2/4, CRT < 3 sec to all digits.  Protective sensation intact. Motor function intact.  RIGHT foot: Incision is well coapted without any evidence of dehiscence and incision appears to be well-healed.  There is no significant erythema.  There is one area she states she is subjectively she has some intermittent swelling and redness to it at times most with wearing shoes.  Thompson test is negative.  Overall Achilles tendon appears to be intact. Nails are mildly hypertrophic, dystrophic with yellow discoloration.  No pain in the nails there is no swelling redness or drainage or any signs of infection. No pain with calf compression, swelling, warmth, erythema.    Assessment and Plan:  Status post right heel surgery  -Treatment options discussed including all alternatives, risks, and complications -Overall she is doing better.  I am going to reorder Voltaren gel for her again today and also the prior authorization if needed but unfortunately did not  get this previously.  Continue with supportive shoes and continue with offloading pads as needed.  I want her to continue home physical therapy on her own.  At this point since she is doing well and discharged from the postoperative care but encouraged to call any questions concerns or any changes in symptoms. -We will have her get an over-the-counter topical nail fungus medicine.  Unfortunately her out the office when I call her once it comes in.  Vivi BarrackMatthew R Maleke Feria DPM

## 2018-03-09 NOTE — Telephone Encounter (Signed)
"  I have a Pharmacy calling me, saying that they have my medication and I have never heard of them. They are saying that they still need Pre Authorization and that it is 50.00."

## 2018-03-10 ENCOUNTER — Telehealth: Payer: Self-pay

## 2018-03-10 NOTE — Telephone Encounter (Signed)
Left voicemail for patient informing her that prior authorization was sent to her insurance company for the diclofenac gel.  Insurance company denied authorization, and this was the reason why it was sent to [facility].  She is to call back with any questions or concerns.

## 2018-03-16 ENCOUNTER — Encounter: Payer: Self-pay | Admitting: Obstetrics & Gynecology

## 2018-03-16 ENCOUNTER — Other Ambulatory Visit: Payer: Self-pay

## 2018-03-16 ENCOUNTER — Ambulatory Visit: Payer: BC Managed Care – PPO | Admitting: Obstetrics & Gynecology

## 2018-03-16 VITALS — BP 132/66 | HR 84 | Resp 16 | Ht 65.75 in | Wt 214.0 lb

## 2018-03-16 DIAGNOSIS — Z01419 Encounter for gynecological examination (general) (routine) without abnormal findings: Secondary | ICD-10-CM | POA: Diagnosis not present

## 2018-03-16 DIAGNOSIS — Z1211 Encounter for screening for malignant neoplasm of colon: Secondary | ICD-10-CM

## 2018-03-16 NOTE — Progress Notes (Signed)
52 y.o. G0P0000 Married White or Caucasian female here for annual exam.  Had heel spur that was removed with Dr. Ardelle Anton.  She is better.  Has had trouble getting this filled due to insurance.    Denies vaginal bleeding.    Still having menstrual cycles.  This are still monthly.  Flow typically lasts 4 days.  First two days are heavier.   PCP:  Dr. Modesto Charon.  [facility].  Will do blood work with him.  Declines blood work today.  Patient's last menstrual period was 02/20/2018.          Sexually active: Yes.    The current method of family planning is none.    Exercising: No.   Smoker:  no  Health Maintenance: Pap:  07/26/16 Neg. HR HPV:neg   03/30/14 Neg  History of abnormal Pap:  no MMG:  07/26/16 BIRADS1:Neg  Colonoscopy:  Never.  Declines cologuard.   BMD:   never TDaP:  2014 Pneumonia vaccine(s):  n/a Shingrix:   No Hep C testing: n/a Screening Labs: not today    reports that she has never smoked. She has never used smokeless tobacco. She reports that she does not drink alcohol or use drugs.  Past Medical History:  Diagnosis Date  . Anemia   . Bone spur of right foot   . Seasonal allergies     Past Surgical History:  Procedure Laterality Date  . NASAL SINUS SURGERY    . TONSILLECTOMY      Current Outpatient Medications  Medication Sig Dispense Refill  . guaiFENesin (MUCINEX) 600 MG 12 hr tablet Take 600 mg by mouth 2 (two) times daily.    . pseudoephedrine (SUDAFED) 60 MG tablet Take 60 mg by mouth every 8 (eight) hours as needed for congestion.    . diclofenac sodium (VOLTAREN) 1 % GEL Apply 2 g topically 4 (four) times daily. Rub into affected area of foot 2 to 4 times daily (Patient not taking: Reported on 03/16/2018) 100 g 2   No current facility-administered medications for this visit.     Family History  Problem Relation Age of Onset  . Diabetes Mother   . Colon cancer Other        maternal cousins  . Hypertension Father     Review of Systems  All other  systems reviewed and are negative.   Exam:   BP 132/66 (BP Location: Right Arm, Patient Position: Sitting, Cuff Size: Large)   Pulse 84   Resp 16   Ht 5' 5.75" (1.67 m)   Wt 214 lb (97.1 kg)   LMP 02/20/2018   BMI 34.80 kg/m    Height: 5' 5.75" (167 cm)  Ht Readings from Last 3 Encounters:  03/16/18 5' 5.75" (1.67 m)  07/26/16 5' 5.5" (1.664 m)  07/24/15 5' 5.5" (1.664 m)    General appearance: alert, cooperative and appears stated age Head: Normocephalic, without obvious abnormality, atraumatic Neck: no adenopathy, supple, symmetrical, trachea midline and thyroid normal to inspection and palpation Lungs: clear to auscultation bilaterally Breasts: normal appearance, no masses or tenderness Heart: regular rate and rhythm Abdomen: soft, non-tender; bowel sounds normal; no masses,  no organomegaly Extremities: extremities normal, atraumatic, no cyanosis or edema Skin: Skin color, texture, turgor normal. No rashes or lesions Lymph nodes: Cervical, supraclavicular, and axillary nodes normal. No abnormal inguinal nodes palpated Neurologic: Grossly normal   Pelvic: External genitalia:  no lesions              Urethra:  normal appearing urethra with no masses, tenderness or lesions              Bartholins and Skenes: normal                 Vagina: normal appearing vagina with normal color and discharge, no lesions              Cervix: no lesions              Pap taken: No. Bimanual Exam:  Uterus:  normal size, contour, position, consistency, mobility, non-tender              Adnexa: normal adnexa and no mass, fullness, tenderness               Rectovaginal: Confirms               Anus:  normal sphincter tone, no lesions  Chaperone was present for exam.  A:  Well Woman with normal exam H/O prediabetes  P:   Mammogram guidelines reviewed.  Aware this is overdue. pap smear with neg HR HPV 2018.  Not indicated today. Colonoscopy is due.  Declines.  Declines cologuard due to  cost.  IFOB given today Declines blood work today Shingrix vaccines information given Good rx discussed with pt for voltaren gel from Dr. Ardelle AntonWagoner return annually or prn

## 2018-03-16 NOTE — Patient Instructions (Signed)
Recombinant Zoster (Shingles) Vaccine, RZV: What You Need to Know    1. Why get vaccinated?  Shingles (also called herpes zoster, or just zoster) is a painful skin rash, often with blisters. Shingles is caused by the varicella zoster virus, the same virus that causes chickenpox. After you have chickenpox, the virus stays in your body and can cause shingles later in life.  You can't catch shingles from another person. However, a person who has never had chickenpox (or chickenpox vaccine) could get chickenpox from someone with shingles.  A shingles rash usually appears on one side of the face or body and heals within 2 to 4 weeks. Its main symptom is pain, which can be severe. Other symptoms can include fever, headache, chills and upset stomach. Very rarely, a shingles infection can lead to pneumonia, hearing problems, blindness, brain inflammation (encephalitis), or death.  For about 1 person in 5, severe pain can continue even long after the rash has cleared up. This long-lasting pain is called post-herpetic neuralgia (PHN).  Shingles is far more common in people 50 years of age and older than in younger people, and the risk increases with age. It is also more common in people whose immune system is weakened because of a disease such as cancer, or by drugs such as steroids or chemotherapy.  At least 1 million people a year in the [place] get shingles.    2. Shingles vaccine (recombinant)  Recombinant shingles vaccine was approved by FDA in 2017 for the prevention of shingles. In clinical trials, it was more than 90% effective in preventing shingles. It can also reduce the likelihood of PHN.  Two doses, 2 to 6 months apart, are recommended for adults 50 and older.  This vaccine is also recommended for people who have already gotten the live shingles vaccine (Zostavax). There is no live virus in this vaccine.    3. Some people should not get this vaccine  Tell your vaccine provider if you:   Have any  severe, life-threatening allergies. A person who has ever had a life-threatening allergic reaction after a dose of recombinant shingles vaccine, or has a severe allergy to any component of this vaccine, may be advised not to be vaccinated. Ask your health care provider if you want information about vaccine components.   Are pregnant or breastfeeding. There is not much information about use of recombinant shingles vaccine in pregnant or nursing women. Your healthcare provider might recommend delaying vaccination.   Are not feeling well. If you have a mild illness, such as a cold, you can probably get the vaccine today. If you are moderately or severely ill, you should probably wait until you recover. Your doctor can advise you.    4. Risks of a vaccine reaction  With any medicine, including vaccines, there is a chance of reactions.  After recombinant shingles vaccination, a person might experience:   Pain, redness, soreness, or swelling at the site of the injection   Headache, muscle aches, fever, shivering, fatigue    In clinical trials, most people got a sore arm with mild or moderate pain after vaccination, and some also had redness and swelling where they got the shot. Some people felt tired, had muscle pain, a headache, shivering, fever, stomach pain, or nausea. About 1 out of 6 people who got recombinant zoster vaccine experienced side effects that prevented them from doing regular activities. Symptoms went away on their own in about 2 to 3 days. Side effects were more   common in younger people.  You should still get the second dose of recombinant zoster vaccine even if you had one of these reactions after the first dose.    Other things that could happen after this vaccine:   People sometimes faint after medical procedures, including vaccination. Sitting or lying down for about 15 minutes can help prevent fainting and injuries caused by a fall. Tell your provider if you feel dizzy or have vision changes or  ringing in the ears.   Some people get shoulder pain that can be more severe and longer-lasting than routine soreness that can follow injections. This happens very rarely.   Any medication can cause a severe allergic reaction. Such reactions to a vaccine are estimated at about 1 in a million doses, and would happen within a few minutes to a few hours after the vaccination.  As with any medicine, there is a very remote chance of a vaccine causing a serious injury or death.  The safety of vaccines is always being monitored. For more information, visit: www.cdc.gov/vaccinesafety/    5. What if there is a serious problem?  What should I look for?   Look for anything that concerns you, such as signs of a severe allergic reaction, very high fever, or unusual behavior.  Signs of a severe allergic reaction can include hives, swelling of the face and throat, difficulty breathing, a fast heartbeat, dizziness, and weakness. These would usually start a few minutes to a few hours after the vaccination.    What should I do?   If you think it is a severe allergic reaction or other emergency that can't wait, call 9-1-1 and get to the nearest hospital. Otherwise, call your health care provider.  Afterward, the reaction should be reported to the Vaccine Adverse Event Reporting System (VAERS). Your doctor should file this report, or you can do it yourself through the VAERS web site atwww.vaers.hhs.govor by calling 1-800-822-7967.  VAERS does not give medical advice.    6. How can I learn more?   Ask your healthcare provider. He or she can give you the vaccine package insert or suggest other sources of information.   Call your local or state health department.   Contact the Centers for Disease Control and Prevention (CDC):  ? Call 1-800-232-4636 (1-800-CDC-INFO) or  ? Visit the CDC's website at www.cdc.gov/vaccines  ?   CDC Vaccine Information Statement (VIS) Recombinant Zoster Vaccine (05/20/2016)  This information is not  intended to replace advice given to you by your health care provider. Make sure you discuss any questions you have with your health care provider.  Document Released: 06/04/2016 Document Revised: 06/04/2016 Document Reviewed: 06/04/2016  Elsevier Interactive Patient Education  2018 Elsevier Inc.

## 2018-03-30 ENCOUNTER — Telehealth: Payer: Self-pay | Admitting: Podiatry

## 2018-03-30 MED ORDER — MELOXICAM 15 MG PO TABS: 15.0000 mg | ORAL_TABLET | Freq: Every day | ORAL | 0 refills | Status: DC

## 2018-03-30 NOTE — Telephone Encounter (Signed)
I called pt with Dr. Gabriel RungWagoner's request and orders. Pt states the area was nickel size of purple on Thursday or Friday has faded now. I asked pt to send to MyChart Dr. Ardelle AntonWagoner and I would send the Mobic to the [facility] at her destination.

## 2018-03-30 NOTE — Telephone Encounter (Addendum)
I called pt, she states the area has gone away that looked like there was blood under the skin. I asked pt if the area looked like a bruise or if she had experienced sudden sharp pain to the area. Pt states she has the same dull throbbing pain with occasional shooting pain, and the gel is not helping at all. I asked pt if there was any reason she could not take oral antiinflammatories and pt stated no. I asked pt if she had a stabilizing boot, and she said she did but it did not pump up. I offered to assist pt with the CAM boot, and she stated she was leaving town in about 1 1/2 hours and our office was 1/2 hour away. I instructed pt to rest, ice 3-4 times daily 15-3220minutes/sessions protecting skin from the ice with a light cloth, and I would ask Dr. Ardelle AntonWagoner for further instructions and call again.

## 2018-03-30 NOTE — Telephone Encounter (Signed)
Can you have her send a picture? We can do mobic 15mg  daily as well and ice.

## 2018-03-30 NOTE — Addendum Note (Signed)
Addended by: Alphia Kava'CONNELL, VALERY D on: 03/30/2018 03:04 PM   Modules accepted: Orders

## 2018-03-30 NOTE — Telephone Encounter (Signed)
Pt called in severe pain, has place on heel causing pain, looks like blood/bruising inside heel. Pt would like to know if she needs to get in to be seen.

## 2018-04-02 NOTE — Telephone Encounter (Signed)
Left message requesting pt call with status, Dr. Ardelle AntonWagoner had not received a photo in MyChart, and wanted to make sure she was okay and offered an appt.

## 2018-04-02 NOTE — Telephone Encounter (Signed)
I have not received anything from mychart with a picture. She is more than welcome for her to come in to be seen. Thanks.

## 2018-04-06 ENCOUNTER — Encounter: Payer: Self-pay | Admitting: Obstetrics & Gynecology

## 2018-11-19 ENCOUNTER — Emergency Department (HOSPITAL_BASED_OUTPATIENT_CLINIC_OR_DEPARTMENT_OTHER): Payer: BC Managed Care – PPO

## 2018-11-19 ENCOUNTER — Encounter (HOSPITAL_BASED_OUTPATIENT_CLINIC_OR_DEPARTMENT_OTHER): Payer: Self-pay | Admitting: Emergency Medicine

## 2018-11-19 ENCOUNTER — Other Ambulatory Visit: Payer: Self-pay

## 2018-11-19 ENCOUNTER — Emergency Department (HOSPITAL_BASED_OUTPATIENT_CLINIC_OR_DEPARTMENT_OTHER)
Admission: EM | Admit: 2018-11-19 | Discharge: 2018-11-19 | Disposition: A | Discharge status: Home / Self Care | Payer: BC Managed Care – PPO | Attending: Emergency Medicine | Admitting: Emergency Medicine

## 2018-11-19 DIAGNOSIS — Z79899 Other long term (current) drug therapy: Secondary | ICD-10-CM | POA: Insufficient documentation

## 2018-11-19 DIAGNOSIS — R0789 Other chest pain: Secondary | ICD-10-CM | POA: Diagnosis not present

## 2018-11-19 DIAGNOSIS — F410 Panic disorder [episodic paroxysmal anxiety] without agoraphobia: Secondary | ICD-10-CM

## 2018-11-19 DIAGNOSIS — F419 Anxiety disorder, unspecified: Secondary | ICD-10-CM | POA: Diagnosis present

## 2018-11-19 LAB — CBC
HCT: 37.3 % (ref 36.0–46.0)
Hemoglobin: 11.3 g/dL — ABNORMAL LOW (ref 12.0–15.0)
MCH: 22.3 pg — ABNORMAL LOW (ref 26.0–34.0)
MCHC: 30.3 g/dL (ref 30.0–36.0)
MCV: 73.7 fL — ABNORMAL LOW (ref 80.0–100.0)
Platelets: 428 10*3/uL — ABNORMAL HIGH (ref 150–400)
RBC: 5.06 MIL/uL (ref 3.87–5.11)
RDW: 17.3 % — ABNORMAL HIGH (ref 11.5–15.5)
WBC: 10.5 10*3/uL (ref 4.0–10.5)
nRBC: 0 % (ref 0.0–0.2)

## 2018-11-19 LAB — TROPONIN I (HIGH SENSITIVITY): Troponin I (High Sensitivity): <2 ng/L (ref <18)

## 2018-11-19 LAB — BASIC METABOLIC PANEL
Anion gap: 10 (ref 5–15)
BUN: 9 mg/dL (ref 6–20)
CO2: 21 mmol/L — ABNORMAL LOW (ref 22–32)
Calcium: 9 mg/dL (ref 8.9–10.3)
Chloride: 107 mmol/L (ref 98–111)
Creatinine, Ser: 0.63 mg/dL (ref 0.44–1.00)
GFR calc Af Amer: >60 mL/min (ref >60)
GFR calc non Af Amer: >60 mL/min (ref >60)
Glucose, Bld: 108 mg/dL — ABNORMAL HIGH (ref 70–99)
Potassium: 4.1 mmol/L (ref 3.5–5.1)
Sodium: 138 mmol/L (ref 135–145)

## 2018-11-19 MED ORDER — LORAZEPAM 1 MG PO TABS
0.5000 mg | ORAL_TABLET | Freq: Once | ORAL | Status: AC
  Administered 2018-11-19: 0.5 mg via ORAL
  Filled 2018-11-19: qty 1

## 2018-11-19 NOTE — ED Provider Notes (Signed)
[facility] EMERGENCY DEPARTMENT Provider Note   CSN: [id number] Arrival date & time: 11/19/18  1218     History   Chief Complaint Chief Complaint  Patient presents with  . Anxiety  . Chest Pain    HPI Stephanie Merritt is a 53 y.o. female.     Patient is a 53 year old female with no significant medical problems but a prior history of stress-induced chest pain who is presenting today with anxiety, chest pressure and feeling terrible.  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  She states her computer is not working well and she is having difficulty getting prepared for school.  In addition there have been some family stresses as well.  She states today when she was doing her work she just started to get overwhelmed and started to have throbbing type pain in the center of her chest that would come and go and sensation of severe anxiety.  She initially laid on the bed and started deeply breathing which did start improving her symptoms but then every time she thought about all the things she needs to do she would feel worse again.  She has no shortness of breath, cough, fever, abdominal pain but has noted some nausea over the last few days, decreased appetite and difficulty sleeping.  She did take 2 aspirin when the discomfort started today but does not take anything regularly.  She denies excessive alcohol use and does not use any drugs or tobacco.  She spoke with her doctor's office who recommended she come here.   Anxiety This is a recurrent problem. The current episode started 3 to 5 hours ago. The problem occurs constantly. The problem has been gradually improving. Associated symptoms include chest pain. The symptoms are aggravated by stress. The symptoms are relieved by relaxation. Treatments tried: rest and deep  breathing. The treatment provided mild relief.  Chest Pain Associated symptoms: anxiety     Past Medical History:  Diagnosis Date  . Anemia   . Bone spur of right foot   . Seasonal allergies     There are no active problems to display for this patient.   Past Surgical History:  Procedure Laterality Date  . NASAL SINUS SURGERY    . TONSILLECTOMY       OB History    Gravida  0   Para  0   Term  0   Preterm  0   AB  0   Living  0     SAB  0   TAB  0   Ectopic  0   Multiple  0   Live Births               Home Medications    Prior to Admission medications   Medication Sig Start Date End Date Taking? Authorizing Provider  diclofenac sodium (VOLTAREN) 1 % GEL Apply 2 g topically 4 (four) times daily. Rub into affected area of foot 2 to 4 times daily Patient not taking: Reported on 03/16/2018 03/02/18   Vivi BarrackWagoner, Matthew R, DPM  guaiFENesin (MUCINEX) 600 MG 12 hr tablet Take 600 mg by mouth 2 (two) times daily.    [provider]  meloxicam (MOBIC) 15 MG tablet Take 1 tablet (15 mg total) by mouth daily. 03/30/18   Ardelle AntonWagoner,  Lesia SagoMatthew R, DPM  pseudoephedrine (SUDAFED) 60 MG tablet Take 60 mg by mouth every 8 (eight) hours as needed for congestion.    [provider]    Family History Family History  Problem Relation Age of Onset  . Diabetes Mother   . Colon cancer Other        maternal cousins  . Hypertension Father     Social History Social History   Tobacco Use  . Smoking status: Never Smoker  . Smokeless tobacco: Never Used  Substance Use Topics  . Alcohol use: No    Alcohol/week: 0.0 standard drinks  . Drug use: No     Allergies   Percocet [oxycodone-acetaminophen]   Review of Systems Review of Systems  Constitutional: Positive for appetite change.  Cardiovascular: Positive for chest pain.  Psychiatric/Behavioral: The patient is nervous/anxious.   All other systems reviewed and are negative.    Physical Exam  Updated Vital Signs BP 136/81 (BP Location: Right Arm)   Pulse 73   Temp 98.2 F (36.8 C) (Oral)   Resp 15   Ht 5\' 5"  (1.651 m)   Wt 95.3 kg   SpO2 100%   BMI 34.95 kg/m   Physical Exam Vitals signs and nursing note reviewed.  Constitutional:      General: She is not in acute distress.    Appearance: She is well-developed. She is obese.  HENT:     Head: Normocephalic and atraumatic.  Eyes:     Conjunctiva/sclera: Conjunctivae normal.     Pupils: Pupils are equal, round, and reactive to light.  Neck:     Musculoskeletal: Normal range of motion and neck supple.  Cardiovascular:     Rate and Rhythm: Normal rate and regular rhythm.     Heart sounds: No murmur.  Pulmonary:     Effort: Pulmonary effort is normal. No respiratory distress.     Breath sounds: Normal breath sounds. No wheezing or rales.  Abdominal:     General: There is no distension.     Palpations: Abdomen is soft.     Tenderness: There is no abdominal tenderness. There is no guarding or rebound.  Musculoskeletal: Normal range of motion.        General: No tenderness.  Skin:    General: Skin is warm and dry.     Findings: No erythema or rash.  Neurological:     Mental Status: She is alert and oriented to person, place, and time.  Psychiatric:        Mood and Affect: Mood is anxious.        Behavior: Behavior normal.        Thought Content: Thought content does not include homicidal or suicidal ideation.     Comments: Tearful on exam      ED Treatments / Results  Labs (all labs ordered are listed, but only abnormal results are displayed) Labs Reviewed  BASIC METABOLIC PANEL - Abnormal; Notable for the following components:      Result Value   CO2 21 (*)    Glucose, Bld 108 (*)    All other components within normal limits  CBC - Abnormal; Notable for the following components:   Hemoglobin 11.3 (*)    MCV 73.7 (*)    MCH 22.3 (*)    RDW 17.3 (*)    Platelets 428 (*)    All other components  within normal limits  TROPONIN I (HIGH SENSITIVITY)    EKG None  ED ECG REPORT  Date: 11/19/2018  Rate: 75  Rhythm: normal sinus rhythm  QRS Axis: normal  Intervals: normal  ST/T Wave abnormalities: normal  Conduction Disutrbances:none  Narrative Interpretation:   Old EKG Reviewed: none available  I have personally reviewed the EKG tracing and agree with the computerized printout as noted.   Radiology Dg Chest 2 View  Result Date: 11/19/2018 CLINICAL DATA:  Acute chest pain for 2 hours.  Nausea. EXAM: CHEST - 2 VIEW COMPARISON:  None. FINDINGS: The heart size and mediastinal contours are within normal limits. Both lungs are clear. The visualized skeletal structures are unremarkable. IMPRESSION: Negative.  No active cardiopulmonary disease. Electronically Signed   By: Marlaine Hind M.D.   On: 11/19/2018 12:59    Procedures Procedures (including critical care time)  Medications Ordered in ED Medications  LORazepam (ATIVAN) tablet 0.5 mg (has no administration in time range)     Initial Impression / Assessment and Plan / ED Course  I have reviewed the triage vital signs and the nursing notes.  Pertinent labs & imaging results that were available during my care of the patient were reviewed by me and considered in my medical decision making (see chart for details).        53 year old female presenting today with atypical chest pain most likely a result of stress and anxiety with potential panic attack.  Patient has had pain like this in the past related to stress with full work-up even with cardiology and an MRI and everything was related to stress.  She has been under an exorbitant amount of stress lately and states every time she thinks about it her symptoms to just get worse.  EKG today is within normal limits.  Labs including troponin are within normal limits.  Patient was given 1.5 mg of Ativan but also recommended following up with her PCP for possible long-term  treatment with an anti-anxiety medication during this time.  Patient has no homicidal or suicidal thoughts.   Final Clinical Impressions(s) / ED Diagnoses   Final diagnoses:  Panic attack  Atypical chest pain    ED Discharge Orders    None       Blanchie Dessert, MD 11/19/18 1348

## 2018-11-19 NOTE — ED Notes (Signed)
Pt verbalized understanding of dc instructions.

## 2018-11-19 NOTE — ED Triage Notes (Signed)
Pt states she thinks she is having an anxiety attack. She is sobbing and upset. Chest pain started 2 hours ago. Nausea started yesterday. 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

## 2019-07-22 ENCOUNTER — Other Ambulatory Visit: Payer: Self-pay

## 2019-07-22 NOTE — Progress Notes (Signed)
54 y.o. G0P0000 Married White or Caucasian female here for annual exam.  Doing well.  Denies vaginal bleeding.  Has been vaccinated against Covid.  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    Had panic attack that caused chest pain last August.  PCP advised her to be seen.  Evaluation was negative.    Has not had a menstrual cycle since November.  Having hot flashes.  Has noticed some increased sweatiness in the morning after a hot shower.  Had virtual visit with PCP and did have blood work this past year.  Reports Vit D was low and HbA1C was 6.4.      Sexually active: No.  The current method of family planning is abstinence.    Exercising: No.  exercise Smoker:  no  Health Maintenance: Pap:  07-26-16 neg HPV HR neg History of abnormal Pap:  no MMG:  07-26-16 category a density birads 1:neg.  Is waiting until she gets 4-6 week from Covid vaccine Colonoscopy:  never BMD:   20 yrs ago TDaP:  2014 Pneumonia vaccine(s):  no Shingrix:   no Hep C testing: not done Screening Labs: [facility]   reports that she has never smoked. She has never used smokeless tobacco. She reports that she does not drink alcohol or use drugs.  Past Medical History:  Diagnosis Date  . Anemia   . Bone spur of right foot   . Seasonal allergies     Past Surgical History:  Procedure Laterality Date  . NASAL SINUS SURGERY    . TONSILLECTOMY      No current outpatient medications on file.   No current facility-administered medications for this visit.    Family History  Problem Relation Age of Onset  . Diabetes Mother   . Colon cancer Other        maternal cousins  . Hypertension Father     Review of Systems  Constitutional: Negative.   HENT: Negative.   Eyes: Negative.   Respiratory: Negative.   Cardiovascular: Negative.   Gastrointestinal: Negative.   Endocrine: Negative.   Genitourinary:       No cycle since 02/2019, hot flashes  Musculoskeletal: Negative.   Skin: Negative.    Allergic/Immunologic: Negative.   Neurological: Negative.   Psychiatric/Behavioral: Negative.     Exam:   BP 118/80   Pulse 68   Temp (!) 97.3 F (36.3 C) (Skin)   Resp 16   Ht 5' 6.25" (1.683 m)   Wt 227 lb (103 kg)   BMI 36.36 kg/m   Height:   Height: 5' 6.25" (168.3 cm)  Ht Readings from Last 3 Encounters:  07/23/19 5' 6.25" (1.683 m)  11/19/18 5\' 5"  (1.651 m)  03/16/18 5' 5.75" (1.67 m)    General appearance: alert, cooperative and appears stated age Head: Normocephalic, without obvious abnormality, atraumatic Neck: no adenopathy, supple, symmetrical, trachea midline and thyroid normal to inspection and palpation Lungs: clear to auscultation bilaterally Breasts: normal appearance, no masses or tenderness Heart: regular rate and rhythm Abdomen: soft, non-tender; bowel sounds normal; no masses,  no organomegaly Extremities: extremities normal, atraumatic, no cyanosis or edema Skin: Skin color, texture, turgor normal. No rashes or lesions Lymph nodes: Cervical, supraclavicular, and axillary nodes normal. No abnormal inguinal nodes palpated Neurologic: Grossly normal   Pelvic: External genitalia:  no lesions              Urethra:  normal appearing urethra with no masses, tenderness or lesions  Bartholins and Skenes: normal                 Vagina: normal appearing vagina with normal color and discharge, no lesions              Cervix: no lesions              Pap taken: Yes.   Bimanual Exam:  Uterus:  normal size, contour, position, consistency, mobility, non-tender              Adnexa: normal adnexa and no mass, fullness, tenderness               Rectovaginal: Confirms               Anus:  normal sphincter tone, no lesions  Chaperone, Zenovia Jordan, CMA, was present for exam.  A:  Well Woman with normal exam Perimenopausal  Prediabetes (last HbA1C was 6.4 per pt hx) Vit D deficiency  P:   Mammogram guidelines reviewed.  Pt is aware this is  overdue. pap smear with HR HPV obtained  Colorectal cancer screening guidelines reviewed.  She is interested in doing Cologuard but will check about coverage and call back. Shingrix vaccination information given [facility] obtained today Rx for Vit D 50K weekly to pharmacy Return annually or prn

## 2019-07-23 ENCOUNTER — Encounter: Payer: Self-pay | Admitting: Obstetrics & Gynecology

## 2019-07-23 ENCOUNTER — Ambulatory Visit: Payer: BC Managed Care – PPO | Admitting: Obstetrics & Gynecology

## 2019-07-23 ENCOUNTER — Other Ambulatory Visit (HOSPITAL_COMMUNITY)
Admission: RE | Admit: 2019-07-23 | Discharge: 2019-07-23 | Disposition: A | Discharge status: Home / Self Care | Payer: BC Managed Care – PPO | Source: Ambulatory Visit | Attending: Obstetrics & Gynecology | Admitting: Obstetrics & Gynecology

## 2019-07-23 ENCOUNTER — Other Ambulatory Visit: Payer: Self-pay

## 2019-07-23 VITALS — BP 118/80 | HR 68 | Temp 97.3°F | Resp 16 | Ht 66.25 in | Wt 227.0 lb

## 2019-07-23 DIAGNOSIS — N912 Amenorrhea, unspecified: Secondary | ICD-10-CM | POA: Diagnosis not present

## 2019-07-23 DIAGNOSIS — Z124 Encounter for screening for malignant neoplasm of cervix: Secondary | ICD-10-CM | POA: Insufficient documentation

## 2019-07-23 DIAGNOSIS — Z01419 Encounter for gynecological examination (general) (routine) without abnormal findings: Secondary | ICD-10-CM

## 2019-07-23 DIAGNOSIS — R7303 Prediabetes: Secondary | ICD-10-CM | POA: Diagnosis not present

## 2019-07-23 MED ORDER — VITAMIN D (ERGOCALCIFEROL) 1.25 MG (50000 UNIT) PO CAPS: 50000.0000 [IU] | ORAL_CAPSULE | ORAL | 4 refills | Status: DC

## 2019-07-23 NOTE — Addendum Note (Signed)
Addended by: Jerene Bears on: 07/23/2019 03:24 PM   Modules accepted: Orders

## 2019-07-25 LAB — FOLLICLE STIMULATING HORMONE: FSH: 35 m[IU]/mL

## 2019-07-26 LAB — CYTOLOGY - PAP
Comment: NEGATIVE
Diagnosis: NEGATIVE
High risk HPV: NEGATIVE

## 2019-07-30 ENCOUNTER — Telehealth: Payer: Self-pay | Admitting: Obstetrics & Gynecology

## 2019-07-30 NOTE — Telephone Encounter (Signed)
Per review of Epic, patient was seen in office on 07/23/19 for AEX. Cologuard discussed. Rx for Vit D sent to pharmacy.   Cologuard order completed and placed on Dr. Rondel Baton desk for signature.   Routing message to Dr. Hyacinth Meeker to update with Vit D results.

## 2019-07-30 NOTE — Telephone Encounter (Signed)
Patient has spoken with her insurance company and Cologuard is covered. She would like proceed with the order. She also said her vitamin result was 15.9 with her pcp.

## 2019-08-02 ENCOUNTER — Other Ambulatory Visit: Payer: Self-pay | Admitting: Obstetrics & Gynecology

## 2019-08-02 NOTE — Telephone Encounter (Signed)
Call to patient, left detailed message, name identified on voicemail, ok per dpr. Advised per Dr. Hyacinth Meeker. Advised [facility] will contact her directly regarding Cologuard collection and process, our office will f/u once final results received. Return call to office if any additional questions.   Encounter closed.

## 2019-08-02 NOTE — Telephone Encounter (Signed)
Thanks for update.  Ok to continue Vit D weekly.  Cologuard order signed and faxed.  Ok to close encounter.

## 2019-09-14 ENCOUNTER — Telehealth: Payer: Self-pay

## 2019-09-14 ENCOUNTER — Other Ambulatory Visit: Payer: Self-pay

## 2019-09-14 DIAGNOSIS — Z1212 Encounter for screening for malignant neoplasm of rectum: Secondary | ICD-10-CM

## 2019-09-14 DIAGNOSIS — Z1211 Encounter for screening for malignant neoplasm of colon: Secondary | ICD-10-CM

## 2019-09-14 LAB — COLOGUARD: Cologuard: NEGATIVE

## 2019-09-14 NOTE — Telephone Encounter (Signed)
Patient aware of negative cologuard stool test.

## 2019-11-04 ENCOUNTER — Encounter: Payer: Self-pay | Admitting: Obstetrics & Gynecology

## 2019-11-04 LAB — COLOGUARD

## 2019-11-08 ENCOUNTER — Encounter: Payer: Self-pay | Admitting: Obstetrics & Gynecology

## 2020-02-20 IMAGING — CR CHEST - 2 VIEW
2 series · 2 of 2 positions shown · non-contrast
Comparison: None.

CLINICAL DATA: Acute chest pain for 2 hours.  Nausea.

EXAM:
CHEST - 2 VIEW

[w chest pa]
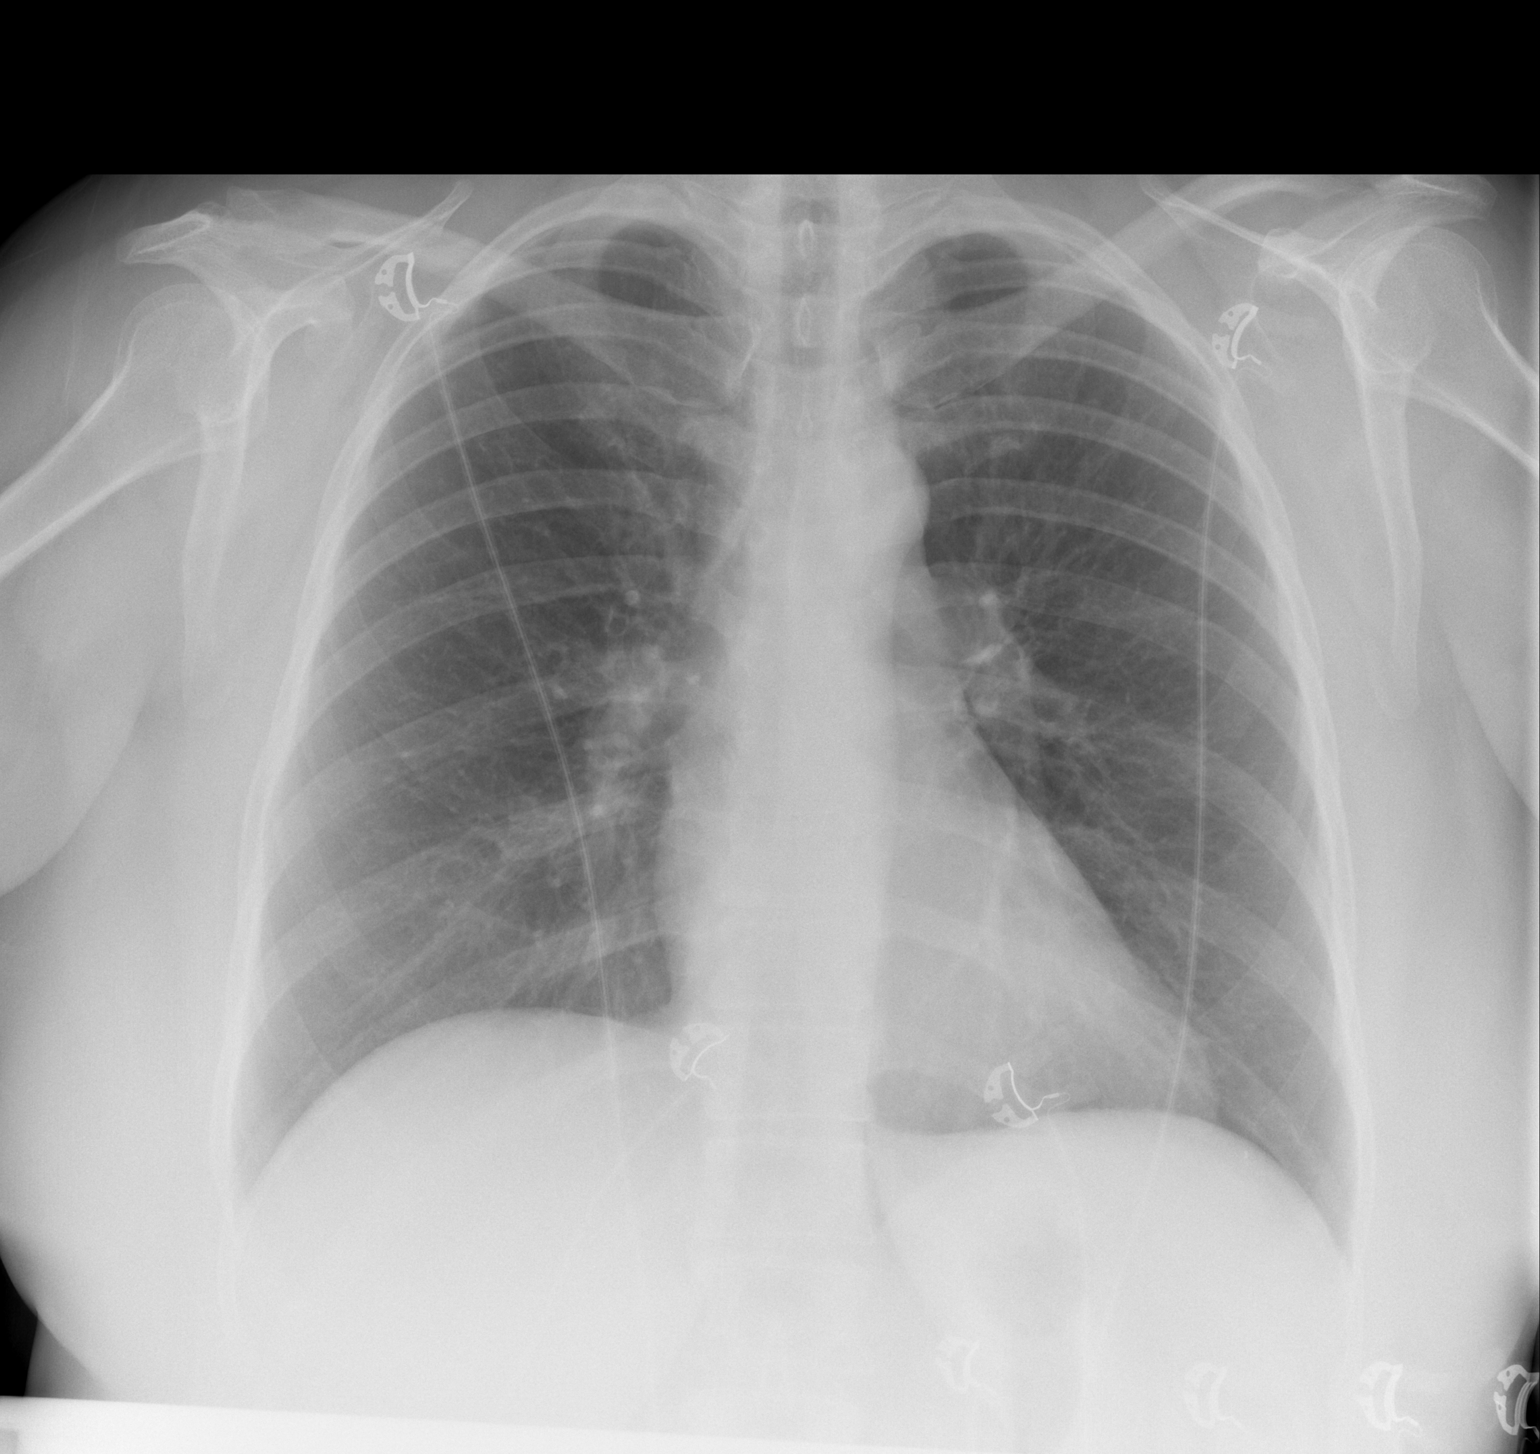

[w chest lat]
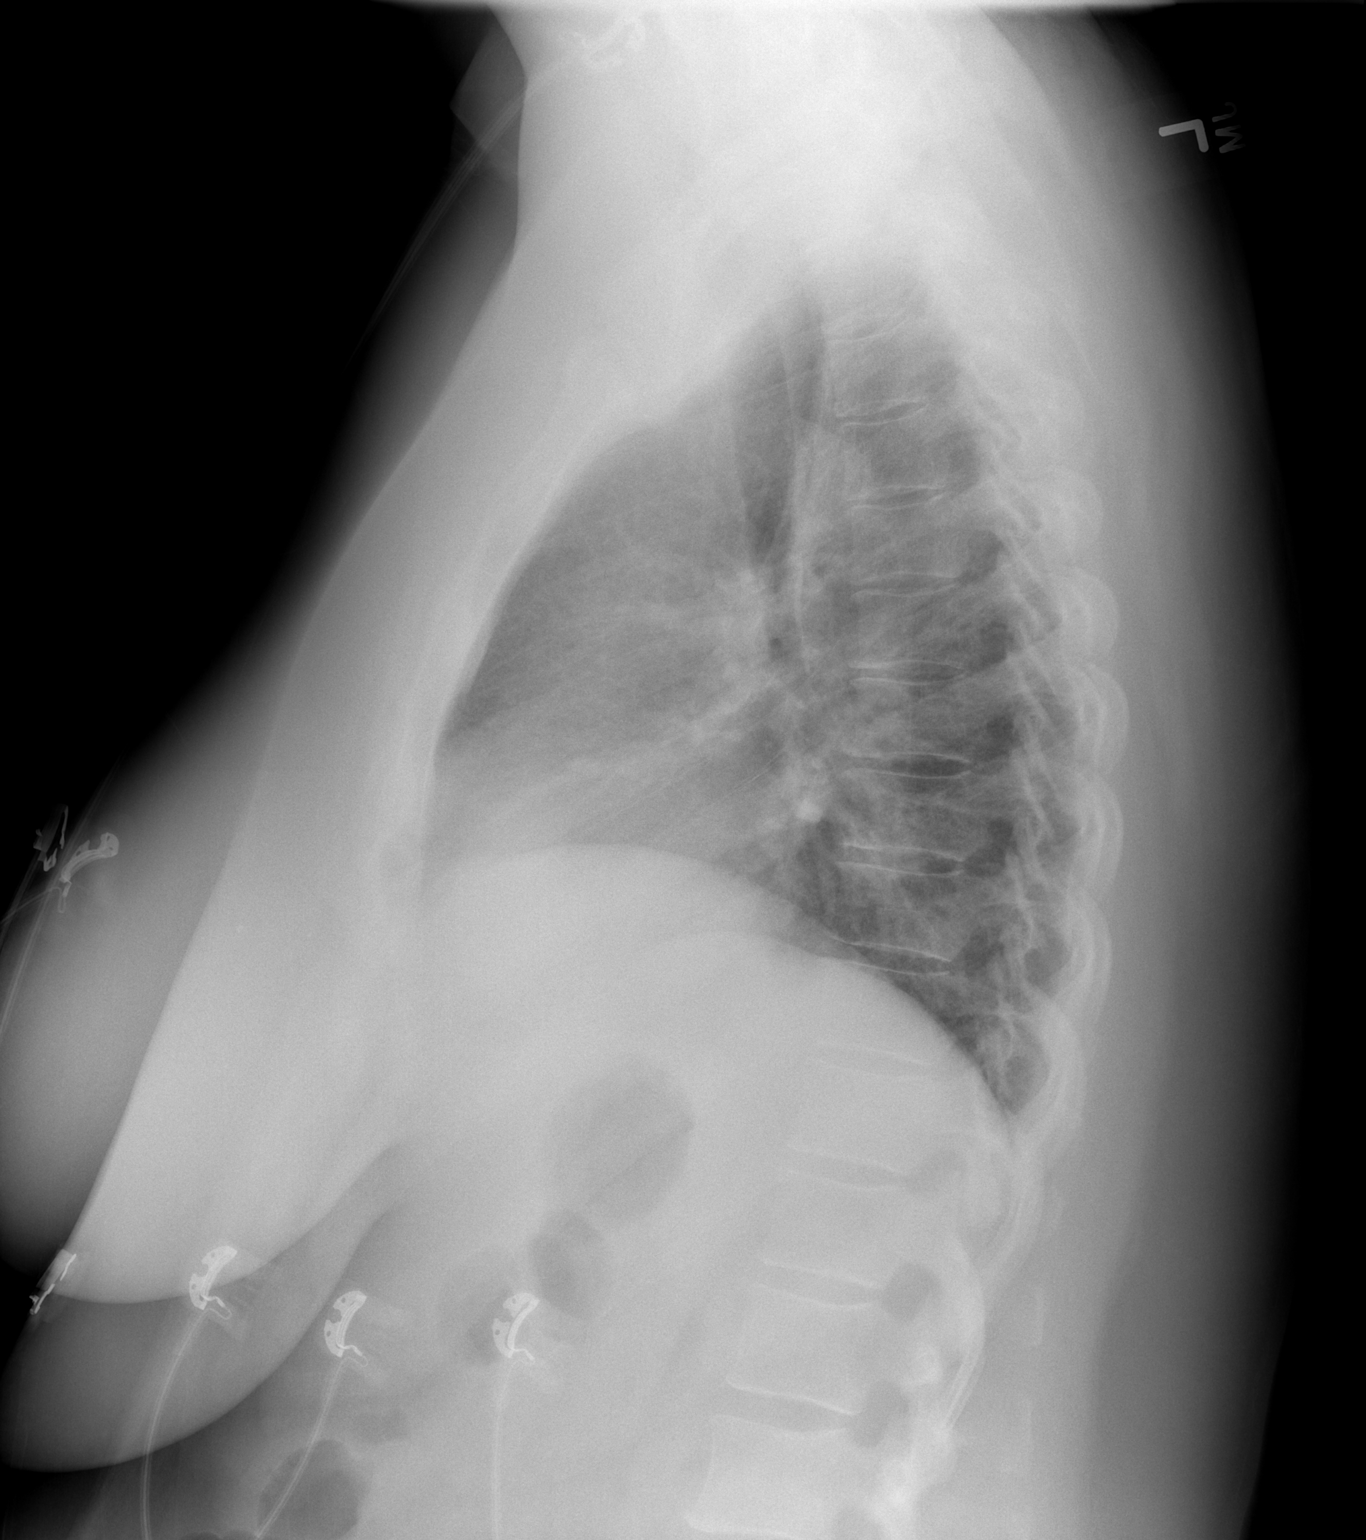

[2 of 2 positions shown; findings below may reference images not displayed]

FINDINGS: The heart size and mediastinal contours are within normal limits.
Both lungs are clear. The visualized skeletal structures are
unremarkable.
IMPRESSION: Negative.  No active cardiopulmonary disease.

## 2020-09-12 ENCOUNTER — Ambulatory Visit (HOSPITAL_BASED_OUTPATIENT_CLINIC_OR_DEPARTMENT_OTHER): Payer: BC Managed Care – PPO | Admitting: Obstetrics & Gynecology

## 2020-10-03 ENCOUNTER — Encounter (HOSPITAL_BASED_OUTPATIENT_CLINIC_OR_DEPARTMENT_OTHER): Payer: Self-pay | Admitting: Obstetrics & Gynecology

## 2020-10-17 ENCOUNTER — Ambulatory Visit: Payer: BC Managed Care – PPO

## 2021-02-16 ENCOUNTER — Encounter (HOSPITAL_BASED_OUTPATIENT_CLINIC_OR_DEPARTMENT_OTHER): Payer: Self-pay | Admitting: Obstetrics & Gynecology

## 2021-02-16 ENCOUNTER — Other Ambulatory Visit: Payer: Self-pay

## 2021-02-16 ENCOUNTER — Ambulatory Visit (INDEPENDENT_AMBULATORY_CARE_PROVIDER_SITE_OTHER): Payer: BC Managed Care – PPO | Admitting: Obstetrics & Gynecology

## 2021-02-16 VITALS — BP 143/88 | HR 76 | Ht 66.0 in | Wt 220.0 lb

## 2021-02-16 DIAGNOSIS — E785 Hyperlipidemia, unspecified: Secondary | ICD-10-CM

## 2021-02-16 DIAGNOSIS — E559 Vitamin D deficiency, unspecified: Secondary | ICD-10-CM

## 2021-02-16 DIAGNOSIS — Z01419 Encounter for gynecological examination (general) (routine) without abnormal findings: Secondary | ICD-10-CM

## 2021-02-16 DIAGNOSIS — N912 Amenorrhea, unspecified: Secondary | ICD-10-CM

## 2021-02-16 DIAGNOSIS — Z0000 Encounter for general adult medical examination without abnormal findings: Secondary | ICD-10-CM

## 2021-02-16 DIAGNOSIS — R195 Other fecal abnormalities: Secondary | ICD-10-CM | POA: Diagnosis not present

## 2021-02-16 DIAGNOSIS — R7303 Prediabetes: Secondary | ICD-10-CM

## 2021-02-16 NOTE — Progress Notes (Signed)
55 y.o. G0P0000 Married White or Caucasian female here for annual exam.  Broke finger with a fall.  She is still having swelling in her finger still.  Doesn't hurt but is stiff.    Last menstrual cycle was 08/2019.  She is aware if she has future bleeding, she needs to let me know.  She is also having hot flashes this year.  She has used black cohosh and this has helped.  Not interested in any prescription medication treatment at this time.  She reports during the past year or so she's having a lot more urgency with bowel movements.  She reports after any meal, she must go to the bathroom in 15 minutes.  Bowel movements are very loose and watery and there is a lot of associated gas.  Has not had a colonoscopy but did have a negative cologuard last year.  She has used an OTC antidiarrheal but she doesn't take it very much.  It does help.  She did try something to help with bacteria in her gut.  Reports this helped as well but it gave her a lot of bloating.     Sexually active: No.  The current method of family planning is abstinence.    Exercising: no Smoker:  no  Health Maintenance: Pap:  07/23/2019 Negative History of abnormal Pap:  no MMG:  10/03/2020 Negative Colonoscopy:  08/2019 cologuard BMD:   not done yet.  Plan around age 67.   Screening Labs: she has not had any blood work done recently   reports that she has never smoked. She has never used smokeless tobacco. She reports that she does not drink alcohol and does not use drugs.  Past Medical History:  Diagnosis Date   Anemia    Bone spur of right foot    Seasonal allergies     Past Surgical History:  Procedure Laterality Date   NASAL SINUS SURGERY     TONSILLECTOMY      Current Outpatient Medications  Medication Sig Dispense Refill   Vitamin D, Ergocalciferol, (DRISDOL) 1.25 MG (50000 UNIT) CAPS capsule Take 1 capsule (50,000 Units total) by mouth every 7 (seven) days. (Patient not taking: Reported on 02/16/2021) 12 capsule  4   No current facility-administered medications for this visit.    Family History  Problem Relation Age of Onset   Diabetes Mother    Colon cancer Other        maternal cousins   Hypertension Father     Review of Systems  Gastrointestinal:  Positive for abdominal distention and diarrhea.   Exam:   BP (!) 143/88 (BP Location: Left Arm, Patient Position: Sitting, Cuff Size: Large)   Pulse 76   Ht 5\' 6"  (1.676 m) Comment: reported  Wt 220 lb (99.8 kg)   BMI 35.51 kg/m   Height: 5\' 6"  (167.6 cm) (reported)  General appearance: alert, cooperative and appears stated age Head: Normocephalic, without obvious abnormality, atraumatic Neck: no adenopathy, supple, symmetrical, trachea midline and thyroid normal to inspection and palpation Lungs: clear to auscultation bilaterally Breasts: normal appearance, no masses or tenderness Heart: regular rate and rhythm Abdomen: soft, non-tender; bowel sounds normal; no masses,  no organomegaly Extremities: extremities normal, atraumatic, no cyanosis or edema Skin: Skin color, texture, turgor normal. No rashes or lesions Lymph nodes: Cervical, supraclavicular, and axillary nodes normal. No abnormal inguinal nodes palpated Neurologic: Grossly normal   Pelvic: External genitalia:  no lesions  Urethra:  normal appearing urethra with no masses, tenderness or lesions              Bartholins and Skenes: normal                 Vagina: normal appearing vagina with normal color and no discharge, no lesions              Cervix: no lesions              Pap taken: No. Bimanual Exam:  Uterus:  normal size, contour, position, consistency, mobility, non-tender              Adnexa: normal adnexa and no mass, fullness, tenderness               Rectovaginal: Confirms               Anus:  normal sphincter tone, no lesions  Chaperone, Ina Homes, CMA, was present for exam.  Assessment/Plan: 1. Well woman exam with routine gynecological  exam - pap neg with neg HR HPV 2021 - MMG up to date - cologuard negative 2021 - BMD discussed  2. Loose stools - Ambulatory referral to Gastroenterology  3. Blood tests for routine general physical examination - Hemoglobin A1c - TSH - Lipid panel - Comprehensive metabolic panel - CBC  4. Vitamin D deficiency  5. Prediabetes  6. Dyslipidemia

## 2021-02-16 NOTE — Patient Instructions (Signed)
Align probiotic.  Take it daily.    

## 2021-02-17 LAB — CBC
Hematocrit: 42.4 % (ref 34.0–46.6)
Hemoglobin: 12.9 g/dL (ref 11.1–15.9)
MCH: 23.8 pg — ABNORMAL LOW (ref 26.6–33.0)
MCHC: 30.4 g/dL — ABNORMAL LOW (ref 31.5–35.7)
MCV: 78 fL — ABNORMAL LOW (ref 79–97)
Platelets: 371 10*3/uL (ref 150–450)
RBC: 5.41 x10E6/uL — ABNORMAL HIGH (ref 3.77–5.28)
RDW: 15.5 % — ABNORMAL HIGH (ref 11.7–15.4)
WBC: 9.1 10*3/uL (ref 3.4–10.8)

## 2021-02-17 LAB — COMPREHENSIVE METABOLIC PANEL
ALT: 52 IU/L — ABNORMAL HIGH (ref 0–32)
AST: 45 IU/L — ABNORMAL HIGH (ref 0–40)
Albumin/Globulin Ratio: 1.4 (ref 1.2–2.2)
Albumin: 4.6 g/dL (ref 3.8–4.9)
Alkaline Phosphatase: 150 IU/L — ABNORMAL HIGH (ref 44–121)
BUN/Creatinine Ratio: 18 (ref 9–23)
BUN: 14 mg/dL (ref 6–24)
Bilirubin Total: 0.6 mg/dL (ref 0.0–1.2)
CO2: 18 mmol/L — ABNORMAL LOW (ref 20–29)
Calcium: 10.1 mg/dL (ref 8.7–10.2)
Chloride: 98 mmol/L (ref 96–106)
Creatinine, Ser: 0.76 mg/dL (ref 0.57–1.00)
Globulin, Total: 3.4 g/dL (ref 1.5–4.5)
Glucose: 429 mg/dL — ABNORMAL HIGH (ref 70–99)
Potassium: 4.7 mmol/L (ref 3.5–5.2)
Sodium: 139 mmol/L (ref 134–144)
Total Protein: 8 g/dL (ref 6.0–8.5)
eGFR: 92 mL/min/{1.73_m2} (ref >59)

## 2021-02-17 LAB — LIPID PANEL
Chol/HDL Ratio: 5.3 ratio — ABNORMAL HIGH (ref 0.0–4.4)
Cholesterol, Total: 216 mg/dL — ABNORMAL HIGH (ref 100–199)
HDL: 41 mg/dL (ref >39)
LDL Chol Calc (NIH): 133 mg/dL — ABNORMAL HIGH (ref 0–99)
Triglycerides: 232 mg/dL — ABNORMAL HIGH (ref 0–149)
VLDL Cholesterol Cal: 42 mg/dL — ABNORMAL HIGH (ref 5–40)

## 2021-02-17 LAB — TSH: TSH: 1.97 u[IU]/mL (ref 0.450–4.500)

## 2021-02-17 LAB — FOLLICLE STIMULATING HORMONE: FSH: 48.8 m[IU]/mL

## 2021-02-17 LAB — HEMOGLOBIN A1C
Est. average glucose Bld gHb Est-mCnc: 295 mg/dL
Hgb A1c MFr Bld: 11.9 % — ABNORMAL HIGH (ref 4.8–5.6)

## 2021-02-17 LAB — VITAMIN D 25 HYDROXY (VIT D DEFICIENCY, FRACTURES): Vit D, 25-Hydroxy: 18.9 ng/mL — ABNORMAL LOW (ref 30.0–100.0)

## 2021-02-21 ENCOUNTER — Encounter (HOSPITAL_BASED_OUTPATIENT_CLINIC_OR_DEPARTMENT_OTHER): Payer: Self-pay

## 2021-03-07 ENCOUNTER — Other Ambulatory Visit (HOSPITAL_BASED_OUTPATIENT_CLINIC_OR_DEPARTMENT_OTHER): Payer: Self-pay | Admitting: Obstetrics & Gynecology

## 2021-03-07 MED ORDER — VITAMIN D (ERGOCALCIFEROL) 1.25 MG (50000 UNIT) PO CAPS: 50000.0000 [IU] | ORAL_CAPSULE | ORAL | 4 refills | Status: AC

## 2021-11-29 ENCOUNTER — Encounter (HOSPITAL_BASED_OUTPATIENT_CLINIC_OR_DEPARTMENT_OTHER): Payer: Self-pay | Admitting: *Deleted

## 2022-04-10 ENCOUNTER — Telehealth (HOSPITAL_BASED_OUTPATIENT_CLINIC_OR_DEPARTMENT_OTHER): Payer: Self-pay | Admitting: Obstetrics & Gynecology

## 2022-04-10 NOTE — Telephone Encounter (Signed)
Called patient and left a message to call the office back to schedule the appointment .  

## 2022-04-22 ENCOUNTER — Ambulatory Visit (HOSPITAL_BASED_OUTPATIENT_CLINIC_OR_DEPARTMENT_OTHER): Payer: BC Managed Care – PPO | Admitting: Obstetrics & Gynecology

## 2022-09-16 ENCOUNTER — Encounter (HOSPITAL_BASED_OUTPATIENT_CLINIC_OR_DEPARTMENT_OTHER): Payer: Self-pay | Admitting: Obstetrics & Gynecology

## 2022-09-16 ENCOUNTER — Other Ambulatory Visit (HOSPITAL_COMMUNITY)
Admission: RE | Admit: 2022-09-16 | Discharge: 2022-09-16 | Disposition: A | Discharge status: Home / Self Care | Payer: BC Managed Care – PPO | Source: Ambulatory Visit | Attending: Obstetrics & Gynecology | Admitting: Obstetrics & Gynecology

## 2022-09-16 ENCOUNTER — Ambulatory Visit (INDEPENDENT_AMBULATORY_CARE_PROVIDER_SITE_OTHER): Payer: BC Managed Care – PPO | Admitting: Obstetrics & Gynecology

## 2022-09-16 VITALS — BP 134/70 | HR 66 | Ht 65.0 in | Wt 214.8 lb

## 2022-09-16 DIAGNOSIS — Z124 Encounter for screening for malignant neoplasm of cervix: Secondary | ICD-10-CM | POA: Insufficient documentation

## 2022-09-16 DIAGNOSIS — E785 Hyperlipidemia, unspecified: Secondary | ICD-10-CM | POA: Diagnosis not present

## 2022-09-16 DIAGNOSIS — R195 Other fecal abnormalities: Secondary | ICD-10-CM

## 2022-09-16 DIAGNOSIS — Z1331 Encounter for screening for depression: Secondary | ICD-10-CM

## 2022-09-16 DIAGNOSIS — E559 Vitamin D deficiency, unspecified: Secondary | ICD-10-CM | POA: Diagnosis not present

## 2022-09-16 DIAGNOSIS — Z01419 Encounter for gynecological examination (general) (routine) without abnormal findings: Secondary | ICD-10-CM | POA: Diagnosis not present

## 2022-09-16 DIAGNOSIS — Z1211 Encounter for screening for malignant neoplasm of colon: Secondary | ICD-10-CM

## 2022-09-16 DIAGNOSIS — Z780 Asymptomatic menopausal state: Secondary | ICD-10-CM

## 2022-09-16 DIAGNOSIS — R7303 Prediabetes: Secondary | ICD-10-CM

## 2022-09-16 NOTE — Addendum Note (Signed)
Addended by: Jerene Bears on: 09/16/2022 11:57 AM   Modules accepted: Orders

## 2022-09-16 NOTE — Progress Notes (Signed)
57 y.o. G0P0000 Married White or Caucasian female here for annual exam.  Reports she is seeing new PCP, Dr. Atha Starks at [place] [facility].  She has worked on weight loss, exercise, dietary changes.  She reports hba1c is now in the prediabetic range.  Lipids are still elevated and statin has been recommended.  She does not want to be on medication if possible.  Coronary calcium scoring discussed today as well.  Denies vaginal bleeding.    Patient's last menstrual period was 02/20/2018.          Sexually active: Yes.    The current method of family planning is post menopausal status.    Exercising: starting to walk again Smoker:  no  Health Maintenance: Pap:  07/23/2019 Negative History of abnormal Pap:  no MMG:  11/23/2021 Negative Colonoscopy:  cologuard 08/2019 BMD:   not indicated Screening Labs: Does with Dr. Atha Starks   reports that she has never smoked. She has never used smokeless tobacco. She reports that she does not drink alcohol and does not use drugs.  Past Medical History:  Diagnosis Date   Anemia    Bone spur of right foot    Seasonal allergies     Past Surgical History:  Procedure Laterality Date   NASAL SINUS SURGERY     TONSILLECTOMY      Current Outpatient Medications  Medication Sig Dispense Refill   Vitamin D, Ergocalciferol, (DRISDOL) 1.25 MG (50000 UNIT) CAPS capsule Take 1 capsule (50,000 Units total) by mouth every 7 (seven) days. 12 capsule 4   No current facility-administered medications for this visit.    Family History  Problem Relation Age of Onset   Diabetes Mother    Colon cancer Other        maternal cousins   Hypertension Father     ROS: Constitutional: negative Genitourinary:negative  Exam:   BP 134/70 (BP Location: Left Arm, Patient Position: Sitting, Cuff Size: Large)   Pulse 66   Ht 5\' 5"  (1.651 m) Comment: Reported  Wt 214 lb 12.8 oz (97.4 kg)   LMP 02/20/2018   BMI 35.74 kg/m   Height: 5\' 5"  (165.1 cm)  (Reported)  General appearance: alert, cooperative and appears stated age Head: Normocephalic, without obvious abnormality, atraumatic Neck: no adenopathy, supple, symmetrical, trachea midline and thyroid normal to inspection and palpation Lungs: clear to auscultation bilaterally Breasts: normal appearance, no masses or tenderness Heart: regular rate and rhythm Abdomen: soft, non-tender; bowel sounds normal; no masses,  no organomegaly Extremities: extremities normal, atraumatic, no cyanosis or edema Skin: Skin color, texture, turgor normal. No rashes or lesions Lymph nodes: Cervical, supraclavicular, and axillary nodes normal. No abnormal inguinal nodes palpated Neurologic: Grossly normal   Pelvic: External genitalia:  no lesions              Urethra:  normal appearing urethra with no masses, tenderness or lesions              Bartholins and Skenes: normal                 Vagina: normal appearing vagina with normal color and no discharge, no lesions              Cervix: no lesions              Pap taken: Yes.   Bimanual Exam:  Uterus:  normal size, contour, position, consistency, mobility, non-tender  Adnexa: normal adnexa and no mass, fullness, tenderness               Rectovaginal: Confirms               Anus:  normal sphincter tone, no lesions  Chaperone, Ina Homes, CMA, was present for exam.  Assessment/Plan: 1. Well woman exam with routine gynecological exam - Pap smear obtained today - Mammogram 11/2021 - Colonoscopy declined.  Cologuard negative 2021.  She is willing to do this again this year. - Bone mineral density not indicated - lab work done with PCP, Dr. Atha Starks - vaccines reviewed/updated  2. Colon cancer screening - Cologuard  3. Vitamin D deficiency - Dr. Atha Starks did RF for prescription for her  4. Dyslipidemia - declines medication - coronary calcium scoring testing discussed  5. Prediabetes - followed by Dr. Atha Starks  6.  Postmenopausal - not on HRT

## 2022-09-19 LAB — CYTOLOGY - PAP
Adequacy: ABSENT
Comment: NEGATIVE
Diagnosis: NEGATIVE
Diagnosis: REACTIVE
High risk HPV: NEGATIVE

## 2022-10-02 LAB — COLOGUARD: COLOGUARD: POSITIVE — AB

## 2022-10-02 NOTE — Addendum Note (Signed)
Addended by: Jerene Bears on: 10/02/2022 11:41 AM   Modules accepted: Orders

## 2022-11-05 ENCOUNTER — Encounter: Payer: Self-pay | Admitting: Internal Medicine

## 2022-11-05 ENCOUNTER — Ambulatory Visit (AMBULATORY_SURGERY_CENTER): Payer: BC Managed Care – PPO | Admitting: *Deleted

## 2022-11-05 VITALS — Ht 65.5 in | Wt 210.0 lb

## 2022-11-05 DIAGNOSIS — R195 Other fecal abnormalities: Secondary | ICD-10-CM

## 2022-11-05 MED ORDER — NA SULFATE-K SULFATE-MG SULF 17.5-3.13-1.6 GM/177ML PO SOLN: 1.0000 | Freq: Once | ORAL | 0 refills | Status: AC

## 2022-11-05 NOTE — Progress Notes (Signed)
Pt's name and DOB verified at the beginning of the pre-visit.  Pt denies any difficulty with ambulating,sitting, laying down or rolling side to side Gave both [facility] main # and MD on call # prior to instructions.  No egg or soy allergy known to patient  No issues known to pt with past sedation with any surgeries or procedures Pt denies having issues being intubated Patient denies ever being intubated Pt has no issues moving head neck or swallowing No FH of Malignant Hyperthermia Pt is not on diet pills Pt is not on home 02  Pt is not on blood thinners  Pt denies issues with constipation  Pt is not on dialysis Pt denise any abnormal heart rhythms  Pt denies any upcoming cardiac testing Pt encouraged to use to use Singlecare or Goodrx to reduce cost  Patient's chart reviewed by Cathlyn Parsons CNRA prior to pre-visit and patient appropriate for the [facility].  Pre-visit completed and red dot placed by patient's name on their procedure day (on provider's schedule).  . Visit by phone Pt states weight is 210 lb Instructed pt why it is important to and  to call if they have any changes in health or new medications. Directed them to the # given and on instructions.   Pt states they will.  Instructions reviewed with pt and pt states understanding. Instructed to review again prior to procedure. Pt states they will.  Instructions sent by mail with coupon and by my chart

## 2022-11-19 ENCOUNTER — Ambulatory Visit (AMBULATORY_SURGERY_CENTER): Payer: BC Managed Care – PPO | Admitting: Internal Medicine

## 2022-11-19 ENCOUNTER — Encounter: Payer: Self-pay | Admitting: Internal Medicine

## 2022-11-19 VITALS — BP 122/75 | HR 61 | Temp 98.0°F | Resp 10 | Ht 65.0 in | Wt 210.0 lb

## 2022-11-19 DIAGNOSIS — R195 Other fecal abnormalities: Secondary | ICD-10-CM | POA: Diagnosis not present

## 2022-11-19 DIAGNOSIS — Z1211 Encounter for screening for malignant neoplasm of colon: Secondary | ICD-10-CM | POA: Diagnosis not present

## 2022-11-19 DIAGNOSIS — D122 Benign neoplasm of ascending colon: Secondary | ICD-10-CM | POA: Diagnosis not present

## 2022-11-19 MED ORDER — SODIUM CHLORIDE 0.9 % IV SOLN: 500.0000 mL | Freq: Once | INTRAVENOUS | Status: DC

## 2022-11-19 NOTE — Progress Notes (Signed)
HISTORY OF PRESENT ILLNESS:  Stephanie Merritt is a 57 y.o. female who is sent for colonoscopy after positive Cologuard testing.  REVIEW OF SYSTEMS:  All non-GI ROS negative except for  Past Medical History:  Diagnosis Date   Anemia    Anxiety    Bone spur of right foot    Diabetes mellitus without complication (HCC)    Seasonal allergies     Past Surgical History:  Procedure Laterality Date   NASAL SINUS SURGERY     TONSILLECTOMY      Social History Stephanie Merritt  reports that she has never smoked. She has never used smokeless tobacco. She reports that she does not drink alcohol and does not use drugs.  family history includes Colon cancer in her paternal uncle and another family member; Diabetes in her mother; Hypertension in her father.  Allergies  Allergen Reactions   Percocet [Oxycodone-Acetaminophen] Nausea Only       PHYSICAL EXAMINATION: Vital signs: BP 131/76   Pulse 65   Temp 98 F (36.7 C)   Ht 5\' 5"  (1.651 m)   Wt 210 lb (95.3 kg)   LMP 02/20/2018   SpO2 99%   BMI 34.95 kg/m  General: Well-developed, well-nourished, no acute distress HEENT: Sclerae are anicteric, conjunctiva pink. Oral mucosa intact Lungs: Clear Heart: Regular Abdomen: soft, nontender, nondistended, no obvious ascites, no peritoneal signs, normal bowel sounds. No organomegaly. Extremities: No edema Psychiatric: alert and oriented x3. Cooperative     ASSESSMENT:  Positive Cologuard testing   PLAN:   Colonoscopy

## 2022-11-19 NOTE — Progress Notes (Signed)
Called to room to assist during endoscopic procedure.  Patient ID and intended procedure confirmed with present staff. Received instructions for my participation in the procedure from the performing physician.  

## 2022-11-19 NOTE — Progress Notes (Signed)
Report to PACU, RN, vss, BBS= Clear.  

## 2022-11-19 NOTE — Patient Instructions (Signed)
Resume previous diet and medications. Awaiting pathology results. Repeat Colonoscopy date to be determined based on pathology results.  YOU HAD AN ENDOSCOPIC PROCEDURE TODAY AT THE [facility]:   Refer to the procedure report that was given to you for any specific questions about what was found during the examination.  If the procedure report does not answer your questions, please call your gastroenterologist to clarify.  If you requested that your care partner not be given the details of your procedure findings, then the procedure report has been included in a sealed envelope for you to review at your convenience later.  YOU SHOULD EXPECT: Some feelings of bloating in the abdomen. Passage of more gas than usual.  Walking can help get rid of the air that was put into your GI tract during the procedure and reduce the bloating. If you had a lower endoscopy (such as a colonoscopy or flexible sigmoidoscopy) you may notice spotting of blood in your stool or on the toilet paper. If you underwent a bowel prep for your procedure, you may not have a normal bowel movement for a few days.  Please Note:  You might notice some irritation and congestion in your nose or some drainage.  This is from the oxygen used during your procedure.  There is no need for concern and it should clear up in a day or so.  SYMPTOMS TO REPORT IMMEDIATELY:  Following lower endoscopy (colonoscopy or flexible sigmoidoscopy):  Excessive amounts of blood in the stool  Significant tenderness or worsening of abdominal pains  Swelling of the abdomen that is new, acute  Fever of 100F or higher   For urgent or emergent issues, a gastroenterologist can be reached at any hour by calling [phone]. Do not use MyChart messaging for urgent concerns.    DIET:  We do recommend a small meal at first, but then you may proceed to your regular diet.  Drink plenty of fluids but you should avoid alcoholic beverages for 24  hours.  ACTIVITY:  You should plan to take it easy for the rest of today and you should NOT DRIVE or use heavy machinery until tomorrow (because of the sedation medicines used during the test).    FOLLOW UP: Our staff will call the number listed on your records the next business day following your procedure.  We will call around 7:15- 8:00 am to check on you and address any questions or concerns that you may have regarding the information given to you following your procedure. If we do not reach you, we will leave a message.     If any biopsies were taken you will be contacted by phone or by letter within the next 1-3 weeks.  Please call us at [phone] if you have not heard about the biopsies in 3 weeks.    SIGNATURES/CONFIDENTIALITY: You and/or your care partner have signed paperwork which will be entered into your electronic medical record.  These signatures attest to the fact that that the information above on your After Visit Summary has been reviewed and is understood.  Full responsibility of the confidentiality of this discharge information lies with you and/or your care-partner. 

## 2022-11-19 NOTE — Progress Notes (Signed)
Pt's states no medical or surgical changes since previsit or office visit. 

## 2022-11-19 NOTE — Op Note (Signed)
[facility] Patient Name: Stephanie Merritt Procedure Date: 11/19/2022 11:51 AM MRN: [id number] Endoscopist: Wilhemina Bonito. Marina Goodell , MD, [id number] Age: 57 Referring MD:  Date of Birth: June 13, 1965 Gender: Female Account #: [id number] Procedure:                Colonoscopy with hot snare polypectomy x 1; cold                            snare polypectomy x 1 Indications:              Screening for colorectal malignant neoplasm.                            Positive Cologuard testing Medicines:                Monitored Anesthesia Care Procedure:                Pre-Anesthesia Assessment:                           - Prior to the procedure, a History and Physical                            was performed, and patient medications and                            allergies were reviewed. The patient's tolerance of                            previous anesthesia was also reviewed. The risks                            and benefits of the procedure and the sedation                            options and risks were discussed with the patient.                            All questions were answered, and informed consent                            was obtained. Prior Anticoagulants: The patient has                            taken no anticoagulant or antiplatelet agents. ASA                            Grade Assessment: II - A patient with mild systemic                            disease. After reviewing the risks and benefits,                            the patient was deemed in satisfactory condition to  undergo the procedure.                           After obtaining informed consent, the colonoscope                            was passed under direct vision. Throughout the                            procedure, the patient's blood pressure, pulse, and                            oxygen saturations were monitored continuously. The                            CF [id number] #[id number] was  introduced through the anus                            and advanced to the the cecum, identified by                            appendiceal orifice and ileocecal valve. The                            ileocecal valve, appendiceal orifice, and rectum                            were photographed. The quality of the bowel                            preparation was excellent. The colonoscopy was                            performed without difficulty. The patient tolerated                            the procedure well. The bowel preparation used was                            SUPREP via split dose instruction. Scope In: 12:02:27 PM Scope Out: 12:20:42 PM Scope Withdrawal Time: 0 hours 9 minutes 21 seconds  Total Procedure Duration: 0 hours 18 minutes 15 seconds  Findings:                 An 8 mm polyp was found in the ascending colon. The                            polyp was pedunculated. The polyp was removed with                            a cold snare. Resection and retrieval were complete.                           A 15 mm polyp was found in the  ascending colon. The                            polyp was pedunculated. The polyp was removed with                            a hot snare. Resection and retrieval were complete.                           The exam was otherwise without abnormality on                            direct and retroflexion views. Complications:            No immediate complications. Estimated blood loss:                            None. Estimated Blood Loss:     Estimated blood loss: none. Impression:               - One 8 mm polyp in the ascending colon, removed                            with a cold snare. Resected and retrieved.                           - One 15 mm polyp in the ascending colon, removed                            with a hot snare. Resected and retrieved.                           - The examination was otherwise normal on direct                             and retroflexion views. Recommendation:           - Repeat colonoscopy in 3 years for surveillance.                           - Patient has a contact number available for                            emergencies. The signs and symptoms of potential                            delayed complications were discussed with the                            patient. Return to normal activities tomorrow.                            Written discharge instructions were provided to the  patient.                           - Resume previous diet.                           - Continue present medications.                           - Await pathology results. Wilhemina Bonito. Marina Goodell, MD 11/19/2022 12:29:11 PM This report has been signed electronically.

## 2022-11-20 ENCOUNTER — Telehealth: Payer: Self-pay | Admitting: *Deleted

## 2022-11-20 NOTE — Telephone Encounter (Signed)
  Follow up Call-     11/19/2022   11:09 AM  Call back number  Post procedure Call Back phone  # [phone]  Permission to leave phone message Yes   [facility]

## 2022-11-22 ENCOUNTER — Encounter: Payer: Self-pay | Admitting: Internal Medicine

## 2023-02-06 ENCOUNTER — Encounter (HOSPITAL_BASED_OUTPATIENT_CLINIC_OR_DEPARTMENT_OTHER): Payer: Self-pay | Admitting: *Deleted

## 2023-09-25 ENCOUNTER — Ambulatory Visit (HOSPITAL_BASED_OUTPATIENT_CLINIC_OR_DEPARTMENT_OTHER): Payer: BC Managed Care – PPO | Admitting: Obstetrics & Gynecology

## 2024-02-02 ENCOUNTER — Encounter (HOSPITAL_BASED_OUTPATIENT_CLINIC_OR_DEPARTMENT_OTHER): Payer: Self-pay | Admitting: Obstetrics & Gynecology
